# Patient Record
Sex: Female | Born: 1970 | Race: White | Hispanic: No | Smoking: Never smoker
Health system: Southern US, Community
[De-identification: ages and names within clinical notes are randomized; demographics above are authoritative.]

## PROBLEM LIST (undated history)

## (undated) DIAGNOSIS — F32A Depression, unspecified: Secondary | ICD-10-CM

## (undated) DIAGNOSIS — F419 Anxiety disorder, unspecified: Secondary | ICD-10-CM

## (undated) DIAGNOSIS — I10 Essential (primary) hypertension: Secondary | ICD-10-CM

## (undated) DIAGNOSIS — I480 Paroxysmal atrial fibrillation: Secondary | ICD-10-CM

## (undated) DIAGNOSIS — F329 Major depressive disorder, single episode, unspecified: Secondary | ICD-10-CM

## (undated) DIAGNOSIS — F909 Attention-deficit hyperactivity disorder, unspecified type: Secondary | ICD-10-CM

## (undated) DIAGNOSIS — G4730 Sleep apnea, unspecified: Secondary | ICD-10-CM

## (undated) DIAGNOSIS — M1990 Unspecified osteoarthritis, unspecified site: Secondary | ICD-10-CM

## (undated) DIAGNOSIS — M26629 Arthralgia of temporomandibular joint, unspecified side: Secondary | ICD-10-CM

## (undated) DIAGNOSIS — J45909 Unspecified asthma, uncomplicated: Secondary | ICD-10-CM

## (undated) DIAGNOSIS — T7840XA Allergy, unspecified, initial encounter: Secondary | ICD-10-CM

## (undated) DIAGNOSIS — I499 Cardiac arrhythmia, unspecified: Secondary | ICD-10-CM

## (undated) HISTORY — DX: Arthralgia of temporomandibular joint, unspecified side: M26.629

## (undated) HISTORY — DX: Unspecified osteoarthritis, unspecified site: M19.90

## (undated) HISTORY — DX: Depression, unspecified: F32.A

## (undated) HISTORY — DX: Attention-deficit hyperactivity disorder, unspecified type: F90.9

## (undated) HISTORY — DX: Cardiac arrhythmia, unspecified: I49.9

## (undated) HISTORY — DX: Essential (primary) hypertension: I10

## (undated) HISTORY — PX: TUBAL LIGATION: SHX77

## (undated) HISTORY — DX: Unspecified asthma, uncomplicated: J45.909

## (undated) HISTORY — DX: Sleep apnea, unspecified: G47.30

## (undated) HISTORY — DX: Paroxysmal atrial fibrillation: I48.0

## (undated) HISTORY — PX: TONSILLECTOMY: SUR1361

## (undated) HISTORY — DX: Allergy, unspecified, initial encounter: T78.40XA

## (undated) HISTORY — DX: Major depressive disorder, single episode, unspecified: F32.9

---

## 2000-05-23 DIAGNOSIS — G248 Other dystonia: Secondary | ICD-10-CM | POA: Insufficient documentation

## 2013-03-27 ENCOUNTER — Ambulatory Visit: Payer: Self-pay | Admitting: Medical

## 2013-03-27 ENCOUNTER — Ambulatory Visit (INDEPENDENT_AMBULATORY_CARE_PROVIDER_SITE_OTHER): Payer: PRIVATE HEALTH INSURANCE | Admitting: Medical

## 2013-03-27 ENCOUNTER — Encounter: Payer: Self-pay | Admitting: Medical

## 2013-03-27 VITALS — BP 142/90 | HR 72 | Temp 98.2°F | Resp 18 | Ht 68.5 in | Wt 201.0 lb

## 2013-03-27 DIAGNOSIS — R110 Nausea: Secondary | ICD-10-CM

## 2013-03-27 DIAGNOSIS — R030 Elevated blood-pressure reading, without diagnosis of hypertension: Secondary | ICD-10-CM

## 2013-03-27 DIAGNOSIS — IMO0001 Reserved for inherently not codable concepts without codable children: Secondary | ICD-10-CM

## 2013-03-27 DIAGNOSIS — R1013 Epigastric pain: Secondary | ICD-10-CM

## 2013-03-27 DIAGNOSIS — R21 Rash and other nonspecific skin eruption: Secondary | ICD-10-CM

## 2013-03-27 MED ORDER — MUPIROCIN 2 % EX OINT: 1.0000 "application " | TOPICAL_OINTMENT | Freq: Two times a day (BID) | CUTANEOUS | Status: DC

## 2013-03-27 MED ORDER — OMEPRAZOLE 40 MG PO CPDR: 40.0000 mg | DELAYED_RELEASE_CAPSULE | Freq: Every day | ORAL | Status: DC

## 2013-03-27 NOTE — Progress Notes (Signed)
Subjective:  Dana Ortiz is a 42 y.o. female who presents as a new patient today.   I see her parents, the Szenas'.    She is here for a complaint of abdominal pain. He reports ongoing abdominal pain since August 3 months ago. Thinks it started as heartburn, would take ibuprofen for this.   This seemed to help .  Thanksgiving evening had a particularly bad case of this, had a lot of belching, hiccups, burn, left upper quadrant pain radiating to the left shoulder. She went to the emergency department, had abdominal ultrasound, labs, sludge in the gallbladder, fatty liver, but liver enzymes are normal.  She is not sure about pancreas enzymes. Was prescribed Prilosec, advised to follow PCP is which she did this past Monday.  This past Monday was prescribed Dexilant by her primary care provider , she took it for 2 days, and stopped this because of nausea.  She called her provider who then called out Zantac and instead, and told her to have gallbladder surgery. She did not take the Zantac, and she is unhappy with the surgery recommendation.   Her pain is set off by eating anything, she can feel it coming on.  10 out of 10 pain, last for 3 hours or until the next meal in and it flares up again. He also reports chronic constipation, has 3 bowel movements a week, which are strained and hard.  He has been trying to eat more bland foods, no changes in stool color, no blood in stool, no mucous in stool, no diarrhea, no vomiting, no fever, no weight loss.  No prior HIDA scan or colonoscopy. He is concerned about an ulcer.  She drinks a lot of water. She doesn't eat a lot of fiber. Not exercising.  She has gained some weight in the past year, has a lot of stress in her life, take Cymbalta, but feels like this may be causing her to gain weight.  Has bursitis in elbow and shoulder.  Uses ibuprofen daily, for the past 5-6 months.  Usually takes 800mg  daily.   Stopped taking this October. Switched to Acetaminophen.  And recently started taking Aleve in the fall.   No other c/o.  The following portions of the patient's history were reviewed and updated as appropriate: allergies, current medications, past family history, past medical history, past social history, past surgical history and problem list.  ROS Otherwise as in subjective above  Objective: Physical Exam  BP 142/90  Pulse 72  Temp(Src) 98.2 F (36.8 C) (Oral)  Resp 18  Ht 5' 8.5" (1.74 m)  Wt 201 lb (91.173 kg)  BMI 30.11 kg/m2  General appearance: alert, no distress, WD/WN, obese white female HEENT: normocephalic, sclerae anicteric, conjunctiva pink and moist, TMs pearly, nares patent, no discharge or erythema, pharynx normal Oral cavity: MMM, no lesions Neck: supple, no lymphadenopathy, no thyromegaly, no masses Heart: RRR, normal S1, S2, no murmurs Lungs: CTA bilaterally, no wheezes, rhonchi, or rales Abdomen: +bs, soft, tender epigastric and somewhat left upper quadrant, otherwise non tender, non distended, no masses, no hepatomegaly, no splenomegaly Pulses: 2+ radial pulses, 2+ pedal pulses, normal cap refill Ext: no edema   Assessment: Encounter Diagnoses  Name Primary?  . Abdominal pain, epigastric Yes  . Nausea alone   . Rash and nonspecific skin eruption   . Elevated blood pressure     Plan: At etiology likely ulcer versus H. pylori versus GERD.  She apparently had abnormal gallbladder ultrasound, but it's not  obvious she has gallbladder disease. Advise she take no more NSAIDs. She will continue omeprazole 40 mg, 30 minutes before breakfast, will continue Zantac over-the-counter twice daily, she will keep her food intake bland and small portion.  We will refer to gastroenterology for likely endoscopy and additional testing.  Request prior records including recent emergency department visit from [facility]  Rash of face-appears to be folliculitis, begin  Bactroban ointment, to call back if not improving  Elevated blood pressure-request prior records, recheck in a month

## 2013-03-28 ENCOUNTER — Telehealth: Payer: Self-pay | Admitting: Medical

## 2013-03-29 ENCOUNTER — Other Ambulatory Visit: Payer: Self-pay | Admitting: Medical

## 2013-03-29 MED ORDER — SUCRALFATE 1 GM/10ML PO SUSP: 1.0000 g | Freq: Three times a day (TID) | ORAL | Status: DC

## 2013-03-29 NOTE — Telephone Encounter (Signed)
Patient is aware of her appointment and I fax over all information to there office. CLS

## 2013-03-29 NOTE — Telephone Encounter (Signed)
LMOM FOR THE OFFICE TO CALLBACK. CLS

## 2013-03-29 NOTE — Telephone Encounter (Signed)
See msg below about referral

## 2013-05-07 ENCOUNTER — Ambulatory Visit (INDEPENDENT_AMBULATORY_CARE_PROVIDER_SITE_OTHER): Payer: PRIVATE HEALTH INSURANCE | Admitting: Medical

## 2013-05-07 ENCOUNTER — Encounter: Payer: Self-pay | Admitting: Medical

## 2013-05-07 VITALS — BP 122/82 | HR 64 | Temp 97.9°F | Resp 16 | Wt 201.0 lb

## 2013-05-07 DIAGNOSIS — M549 Dorsalgia, unspecified: Secondary | ICD-10-CM

## 2013-05-07 DIAGNOSIS — R079 Chest pain, unspecified: Secondary | ICD-10-CM

## 2013-05-07 DIAGNOSIS — R109 Unspecified abdominal pain: Secondary | ICD-10-CM

## 2013-05-07 LAB — LIPASE: Lipase: 17 U/L (ref 0–75)

## 2013-05-07 LAB — AMYLASE: AMYLASE: 30 U/L (ref 0–105)

## 2013-05-07 NOTE — Progress Notes (Signed)
  Subjective:  Dana Ortiz is a 43 y.o. female who presents for recheck.  I saw her recently as a new patient for chronic abdominal pain issues.     Abdominal pain - still having ongoing pains. Saw general surgery, Dr. Eliseo SquiresBeachum.  Had EGD, and reportedly had hiatal hernia.  No GERD, ulcer, H Pylori seen.  Still having pain and frustrated.  Drinks 1 glass of wine nightly.    Chest pain - went to the ED this past week after having chest pain, left arm pain, tingling of arm pain.  Cardiac source rule out.  Had EKG, labs, CXR, chest CT. But they told her to f/u with PCP.  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   Not on any medication now.  No recent cardiology follow up.  No other c/o.  The following portions of the patient's history were reviewed and updated as appropriate: allergies, current medications, past family history, past medical history, past social history, past surgical history and problem list.  ROS Otherwise as in subjective above  Objective: Physical Exam  BP 122/82  Pulse 64  Temp(Src) 97.9 F (36.6 C) (Oral)  Resp 16  Wt 201 lb (91.173 kg)   General appearance: alert, no distress, WD/WN Neck: supple, no lymphadenopathy, no thyromegaly, no masses Chest: nontender Heart: RRR, normal S1, S2, no murmurs Lungs: CTA bilaterally, no wheezes, rhonchi, or rales Abdomen: +bs, soft, non tender, non distended, no masses, no hepatomegaly, no splenomegaly Back: mild upper back paraspinal tenderness, but otherwise nontender Pulses: 2+ radial pulses, 2+ pedal pulses, normal cap refill Ext: no edema   Assessment: Encounter Diagnoses  Name Primary?  . Abdominal pain Yes  . Back pain   . Chest pain     Plan: Unfortunately we have not received any [facility] or Dr. Eliseo SquiresBeachum.  Abdominal pain - lipase and amylase today.   Consider HIDA scan.  Will await  records.  For now, begin Miralax daily, avoid GERD triggers, big portions, gas causing foods.   Back pain - musculoskeletal.  Advised heat, massage, tylenol prn. Chest pain - will await records.   chset pain likely was musculoskeletal vs indigestion vs constipation vs prinzmetal's angina.  Consider referral back to cardiology. Follow up: pending records

## 2014-01-27 DIAGNOSIS — F909 Attention-deficit hyperactivity disorder, unspecified type: Secondary | ICD-10-CM | POA: Insufficient documentation

## 2014-12-18 DIAGNOSIS — J309 Allergic rhinitis, unspecified: Secondary | ICD-10-CM | POA: Insufficient documentation

## 2014-12-18 DIAGNOSIS — J4520 Mild intermittent asthma, uncomplicated: Secondary | ICD-10-CM | POA: Insufficient documentation

## 2015-02-23 DIAGNOSIS — F411 Generalized anxiety disorder: Secondary | ICD-10-CM | POA: Insufficient documentation

## 2015-10-30 DIAGNOSIS — G8929 Other chronic pain: Secondary | ICD-10-CM | POA: Insufficient documentation

## 2016-02-18 DIAGNOSIS — I10 Essential (primary) hypertension: Secondary | ICD-10-CM | POA: Insufficient documentation

## 2016-07-21 ENCOUNTER — Telehealth: Payer: Self-pay | Admitting: *Deleted

## 2016-07-21 NOTE — Telephone Encounter (Signed)
Notes sent scheduling °

## 2016-08-04 ENCOUNTER — Encounter: Payer: Self-pay | Admitting: *Deleted

## 2016-08-05 ENCOUNTER — Ambulatory Visit (INDEPENDENT_AMBULATORY_CARE_PROVIDER_SITE_OTHER): Payer: BLUE CROSS/BLUE SHIELD | Admitting: Cardiovascular Disease

## 2016-08-05 ENCOUNTER — Encounter: Payer: Self-pay | Admitting: Cardiovascular Disease

## 2016-08-05 ENCOUNTER — Telehealth: Payer: Self-pay | Admitting: Cardiovascular Disease

## 2016-08-05 VITALS — BP 116/60 | HR 57 | Ht 68.0 in | Wt 176.6 lb

## 2016-08-05 DIAGNOSIS — I493 Ventricular premature depolarization: Secondary | ICD-10-CM | POA: Diagnosis not present

## 2016-08-05 DIAGNOSIS — I4891 Unspecified atrial fibrillation: Secondary | ICD-10-CM | POA: Diagnosis not present

## 2016-08-05 DIAGNOSIS — R079 Chest pain, unspecified: Secondary | ICD-10-CM | POA: Diagnosis not present

## 2016-08-05 DIAGNOSIS — I10 Essential (primary) hypertension: Secondary | ICD-10-CM | POA: Diagnosis not present

## 2016-08-05 DIAGNOSIS — R002 Palpitations: Secondary | ICD-10-CM

## 2016-08-05 DIAGNOSIS — Z9289 Personal history of other medical treatment: Secondary | ICD-10-CM

## 2016-08-05 MED ORDER — METOPROLOL SUCCINATE ER 50 MG PO TB24: 50.0000 mg | ORAL_TABLET | Freq: Every day | ORAL | 1 refills | Status: DC

## 2016-08-05 NOTE — Patient Instructions (Signed)
Your physician recommends that you schedule a follow-up appointment in: 1 MONTH WITH DR [facility]  Your physician has recommended you make the following change in your medication:   STOP LOPRESSOR  START TOPROL XL 50 MG DAILY  Your physician has requested that you have an echocardiogram. Echocardiography is a painless test that uses sound waves to create images of your heart. It provides your doctor with information about the size and shape of your heart and how well your heart's chambers and valves are working. This procedure takes approximately one hour. There are no restrictions for this procedure.  Your physician recommends that you return for lab work K/MG  Thank you for choosing [facility]!!

## 2016-08-05 NOTE — Telephone Encounter (Signed)
ECHO May 16th in [place]

## 2016-08-05 NOTE — Progress Notes (Signed)
CARDIOLOGY CONSULT NOTE  Patient ID: Dana Ortiz MRN: [id number] DOB/AGE: [birth year] 46 y.o.  Admit date: (Not on file) Primary Physician: Rebecka Apley, NP Referring Physician: Hemberg  Reason for Consultation: atrial fibrillation  HPI: Dana Ortiz is a 46 y.o. female who is being seen today for the evaluation of atrial fibrillation at the request of Hemberg, Ruby Cola, NP.   She is a 46 year old woman who was evaluated for chest pain and palpitations at [facility] on 07/07/16. I personally reviewed all office notes as well as hospitalization documentation, labs, and studies.  Heart rate was initially 138 bpm upon arrival. Oxygen saturations were normal. She was found to be in new onset rapid atrial fibrillation and was given IV metoprolol. Electrolytes and TSH were normal. She was started on metoprolol and Xarelto.  Pertinent labs: Sodium 138, potassium 3.6, BUN 18, creatinine 0.69, magnesium 2.1, normal troponin, TSH 0.69, d-dimer 0.4, hemoglobin 15.8.  Chest x-ray showed no active disease.  I personally reviewed the ECG performed on 07/07/16 at 9:55 AM which demonstrated atrial fibrillation, heart rate 78 bpm.  I personally reviewed the ECG performed on 07/07/16 performed at 8:05 AM which showed rapid atrial fibrillation, heart rate 137 bpm.  She moved here about 12 years ago from [place]. She has had palpitations on and off for several years. 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 She was started on magnesium and had been doing well ever since then.  She has had increasing exertional dyspnea over the past few years particularly when climbing stairs.  Since being discharged from the hospital she has not felt well. She has had palpitations and sensations of flushing and lightheadedness. She was prescribed metoprolol 25 mg twice daily but has taken 50 mg every  morning for the past 2 mornings. She has not taken Xarelto for the past 3 days and she is on her menstrual cycle.  She has also had episodic chest pains in the retrosternal region and left inframammary region.  ECG performed in the office today which I ordered and personally interpreted demonstrates normal sinus rhythm with frequent PVC's, 73 bpm.   Social history: She is originally from [place]. She is divorced. She has a boyfriend. 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   Allergies  Allergen Reactions  . Morphine And Related Itching    Current Outpatient Prescriptions  Medication Sig Dispense Refill  . amphetamine-dextroamphetamine (ADDERALL) 20 MG tablet Take 20 mg by mouth 3 (three) times daily.    . budesonide (PULMICORT) 180 MCG/ACT inhaler Inhale into the lungs 2 (two) times daily.    . cyclobenzaprine (FLEXERIL) 10 MG tablet Take 10 mg by mouth 3 (three) times daily as needed for muscle spasms.    . fluticasone (FLONASE) 50 MCG/ACT nasal spray Place into both nostrils daily.    Marland Kitchen HYDROcodone-acetaminophen (NORCO/VICODIN) 5-325 MG tablet Take 1 tablet by mouth every 6 (six) hours as needed for moderate pain.    . metoprolol tartrate (LOPRESSOR) 25 MG tablet Take 25 mg by mouth 2 (two) times daily.    . rivaroxaban (XARELTO) 20 MG TABS tablet Take 20 mg by mouth daily with supper.      No current facility-administered medications for this visit.     Past Medical History:  Diagnosis Date  . Allergy   . Arrhythmia  afib  . Asthma   . Depression   . Hypertension     Past Surgical History:  Procedure Laterality Date  . TUBAL LIGATION      Social History   Social History  . Marital status: Married    Spouse name: N/A  . Number of children: N/A  . Years of education: N/A   Occupational History  . Not on file.   Social History Main Topics  . Smoking status: Never Smoker  . Smokeless tobacco: Never Used  . Alcohol use Not  on file  . Drug use: No  . Sexual activity: Not on file   Other Topics Concern  . 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  Current Meds  Medication Sig  . amphetamine-dextroamphetamine (ADDERALL) 20 MG tablet Take 20 mg by mouth 3 (three) times daily.  . budesonide (PULMICORT) 180 MCG/ACT inhaler Inhale into the lungs 2 (two) times daily.  . cyclobenzaprine (FLEXERIL) 10 MG tablet Take 10 mg by mouth 3 (three) times daily as needed for muscle spasms.  . fluticasone (FLONASE) 50 MCG/ACT nasal spray Place into both nostrils daily.  Marland Kitchen HYDROcodone-acetaminophen (NORCO/VICODIN) 5-325 MG tablet Take 1 tablet by mouth every 6 (six) hours as needed for moderate pain.  . metoprolol tartrate (LOPRESSOR) 25 MG tablet Take 25 mg by mouth 2 (two) times daily.      Review of systems complete and found to be negative unless listed above in HPI    Physical exam Blood pressure 118/62, pulse (!) 55, height  (1.727 m), weight 176 lb 9.6 oz (80.1 kg), SpO2 98 %. General: NAD Neck: No JVD, no thyromegaly or thyroid nodule.  Lungs: Clear to auscultation bilaterally with normal respiratory effort. CV: Nondisplaced PMI. Regular rate and irregular rhythm with frequent premature contractions, normal S1/S2, no S3/S4, no murmur.  No peripheral edema.  No carotid bruit.    Abdomen: Soft, nontender, no distention.  Skin: Intact without lesions or rashes.  Neurologic: Alert and oriented x 3.  Psych: Normal affect. Extremities: No clubbing or cyanosis.  HEENT: Normal.   ECG: Most recent ECG reviewed.   Labs: No results found for: K, BUN, CREATININE, ALT, TSH, HGB   Lipids: No results found for: LDLCALC, LDLDIRECT, CHOL, TRIG, HDL      ASSESSMENT AND PLAN:  1. New onset atrial fibrillation: She continues to have palpitations and has frequent PVC's. I will switch  metoprolol tartrate to succinate, 50 mg every morning. I will check K and Mg levels. She is anticoagulated with Xarelto. I will order a 2-D echocardiogram with Doppler to evaluate cardiac structure, function, and regional wall motion. We talked about AV nodal blocking agents, antiarrhythmic therapy, and possible ablation.  CHADSVASC score is 2 (HTN, gender), thus I will continue Xarelto.  2. Hypertension: Controlled. Will monitor given switch to Toprol-XL.  3. Frequent PVC's/palpitations:  I will check K and Mg levels. I will switch metoprolol tartrate to succinate, 50 mg every morning.  Disposition: Follow up in 1 month  Signed: Prentice Docker, M.D., F.A.C.C.  08/05/2016, 8:54 AM

## 2016-08-31 ENCOUNTER — Telehealth: Payer: Self-pay | Admitting: Cardiovascular Disease

## 2016-08-31 ENCOUNTER — Other Ambulatory Visit: Payer: BLUE CROSS/BLUE SHIELD

## 2016-08-31 NOTE — Telephone Encounter (Signed)
Pre-cert Verification for the following procedure    Echo (rescheduled per patient) for 09/29/16 at [facility] Will need to re-certify

## 2016-09-14 ENCOUNTER — Ambulatory Visit: Payer: BLUE CROSS/BLUE SHIELD | Admitting: Cardiovascular Disease

## 2016-09-29 ENCOUNTER — Other Ambulatory Visit: Payer: Self-pay

## 2016-09-29 ENCOUNTER — Ambulatory Visit (INDEPENDENT_AMBULATORY_CARE_PROVIDER_SITE_OTHER): Payer: BLUE CROSS/BLUE SHIELD

## 2016-09-29 DIAGNOSIS — I4891 Unspecified atrial fibrillation: Secondary | ICD-10-CM | POA: Diagnosis not present

## 2016-09-30 ENCOUNTER — Telehealth: Payer: Self-pay | Admitting: *Deleted

## 2016-09-30 NOTE — Telephone Encounter (Signed)
Notes recorded by Lesle ChrisHill, Angela G, LPN on 1/61/09606/15/2018 at 4:40 PM EDT Left message to return call.  ------  Notes recorded by Laqueta LindenKoneswaran, Suresh A, MD on 09/29/2016 at 4:32 PM EDT Normal pumping function.

## 2016-10-03 NOTE — Telephone Encounter (Signed)
Notes recorded by Lesle ChrisHill, Angela G, LPN on 6/96/29526/18/2018 at 4:37 PM EDT Patient notified. Copy to pmd. Follow up scheduled for 10/24/2016

## 2016-10-24 ENCOUNTER — Ambulatory Visit (INDEPENDENT_AMBULATORY_CARE_PROVIDER_SITE_OTHER): Payer: BLUE CROSS/BLUE SHIELD | Admitting: Cardiovascular Disease

## 2016-10-24 ENCOUNTER — Encounter: Payer: Self-pay | Admitting: Cardiovascular Disease

## 2016-10-24 VITALS — BP 151/78 | HR 68 | Ht 68.0 in | Wt 187.2 lb

## 2016-10-24 DIAGNOSIS — T50905A Adverse effect of unspecified drugs, medicaments and biological substances, initial encounter: Secondary | ICD-10-CM

## 2016-10-24 DIAGNOSIS — T887XXA Unspecified adverse effect of drug or medicament, initial encounter: Secondary | ICD-10-CM

## 2016-10-24 DIAGNOSIS — R4189 Other symptoms and signs involving cognitive functions and awareness: Secondary | ICD-10-CM | POA: Diagnosis not present

## 2016-10-24 DIAGNOSIS — R002 Palpitations: Secondary | ICD-10-CM | POA: Diagnosis not present

## 2016-10-24 DIAGNOSIS — I480 Paroxysmal atrial fibrillation: Secondary | ICD-10-CM | POA: Diagnosis not present

## 2016-10-24 DIAGNOSIS — I493 Ventricular premature depolarization: Secondary | ICD-10-CM

## 2016-10-24 DIAGNOSIS — I10 Essential (primary) hypertension: Secondary | ICD-10-CM

## 2016-10-24 MED ORDER — DILTIAZEM HCL ER COATED BEADS 120 MG PO CP24: 120.0000 mg | ORAL_CAPSULE | Freq: Every day | ORAL | 6 refills | Status: DC

## 2016-10-24 NOTE — Patient Instructions (Signed)
Medication Instructions:   Stop Toprol XL.  Begin Cardizem CD 120mg  daily.  Continue all other medications.    Labwork: none  Testing/Procedures: none  Follow-Up: 3rd week of August   Any Other Special Instructions Will Be Listed Below (If Applicable).  If you need a refill on your cardiac medications before your next appointment, please call your pharmacy.

## 2016-10-24 NOTE — Progress Notes (Signed)
SUBJECTIVE: The patient presents for follow-up of atrial fibrillation.  Echocardiogram 09/29/16 demonstrated normal left ventricular systolic function and regional wall motion, LVEF 55-60%, mild mitral regurgitation, with mild to moderate LVH. The left atrium was normal in size.  While palpitations have been less frequent, she has felt very fatigued on Toprol-XL. She switched from taking it in the morning to taking it in the evenings and while this has helped to some degree, she still feels sluggish when she wakes up. She has also put on about 11 pounds.   She has been exercising at [employer] daily. She notices certain foods which increase the frequency of palpitations such as chocolate, wine, beer, and medications such as ibuprofen.  She denies orthopnea and leg swelling.  She did not get her potassium and magnesium checked yet. She takes supplemental magnesium tablets 250 mg daily and also tries to eat bananas regularly.     Social history: She is originally from [place]. She is divorced. She has a boyfriend. 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 She attended college at [employer].  Review of Systems: As per "subjective", otherwise negative.  Allergies  Allergen Reactions  . Morphine And Related Itching    Current Outpatient Prescriptions  Medication Sig Dispense Refill  . amphetamine-dextroamphetamine (ADDERALL) 20 MG tablet Take 20 mg by mouth 3 (three) times daily.    . budesonide (PULMICORT) 180 MCG/ACT inhaler Inhale into the lungs 2 (two) times daily.    . cyclobenzaprine (FLEXERIL) 10 MG tablet Take 10 mg by mouth 3 (three) times daily as needed for muscle spasms.    . fluticasone (FLONASE) 50 MCG/ACT nasal spray Place into both nostrils daily.    Marland Kitchen. HYDROcodone-acetaminophen (NORCO/VICODIN) 5-325 MG tablet Take 1 tablet by mouth every 6 (six) hours as needed for moderate pain.    . metoprolol succinate (TOPROL-XL) 50 MG 24  hr tablet Take 1 tablet (50 mg total) by mouth daily. 90 tablet 1  . rivaroxaban (XARELTO) 20 MG TABS tablet Take 20 mg by mouth daily with supper.      No current facility-administered medications for this visit.     Past Medical History:  Diagnosis Date  . Allergy   . Arrhythmia    afib  . Asthma   . Depression   . Hypertension     Past Surgical History:  Procedure Laterality Date  . TUBAL LIGATION      Social History   Social History  . Marital status: Married    Spouse name: N/A  . Number of children: N/A  . Years of education: N/A   Occupational History  . Not on file.   Social History Main Topics  . Smoking status: Never Smoker  . Smokeless tobacco: Never Used  . Alcohol use Not on file  . Drug use: No  . Sexual activity: Not on file   Other Topics Concern  . 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 Neck: No JVD,  no thyromegaly. Lungs: Clear to auscultation bilaterally with normal respiratory effort. CV: Nondisplaced PMI.  Regular rate and rhythm, normal S1/S2, no S3/S4, no murmur. No pretibial or periankle edema.     Abdomen: Soft, nontender, no distention.  Neurologic: Alert and oriented.  Psych: Normal affect. Skin: Normal. Musculoskeletal: No gross deformities.    ECG: Most recent ECG reviewed.   Labs: No results found for: K, BUN, CREATININE, ALT, TSH, HGB   Lipids: No results found for: LDLCALC, LDLDIRECT, CHOL, TRIG, HDL     ASSESSMENT AND PLAN: 1. Paroxysmal atrial fibrillation: Currently on Toprol-XL 50 mg daily with multiple adverse effects as detailed above.  I will switch to long-acting diltiazem 120 mg daily. She is anticoagulated with Xarelto. We previously talked about antiarrhythmic therapy and possible ablation.   2. Hypertension: Blood pressure is elevated. I will monitor given switch from metoprolol succinate to long-acting diltiazem.  3. Frequent PVC's/palpitations: These have improved with dietary changes and the addition of long-acting AV nodal blocking agents. She will have her potassium and magnesium levels checked.    Disposition: Follow up late August 2018   Prentice Docker, M.D., F.A.C.C.

## 2016-10-24 NOTE — Addendum Note (Signed)
Addended by: Lesle ChrisHILL, Takara Sermons G on: 10/24/2016 05:01 PM   Modules accepted: Orders

## 2016-10-28 ENCOUNTER — Other Ambulatory Visit: Payer: Self-pay | Admitting: *Deleted

## 2016-10-28 MED ORDER — RIVAROXABAN 20 MG PO TABS: 20.0000 mg | ORAL_TABLET | Freq: Every day | ORAL | 3 refills | Status: DC

## 2016-11-02 ENCOUNTER — Telehealth: Payer: Self-pay | Admitting: Cardiovascular Disease

## 2016-11-02 NOTE — Telephone Encounter (Signed)
Thinks she may be having restless leg syndrome.  Thinks may be related to the Diltiazem.  Also, has metallic taste in her mouth.  Not sleeping well at night either.  Did not have these symptoms till starting this medication.  No c/o chest pain, dizziness, or SOB.  Does actually think that the AFib is better on this medication though.    C/O the Xarelto making her bleed a lot heavier on her menses.  Any other options on that or can she hold during that time frame.

## 2016-11-02 NOTE — Telephone Encounter (Signed)
LMTCB

## 2016-11-02 NOTE — Telephone Encounter (Signed)
Patient thinks she is experiencing lots of bad symptoms since starting medcation cant sleep and metal taste in  Mouth  Also, has questions about xralto

## 2016-11-02 NOTE — Telephone Encounter (Signed)
She did not tolerate Toprol-XL and now is not tolerating diltiazem. I would have her evaluated in atrial fibrillation clinic for additional options. Ok to hold Xarelto during menses for now.

## 2016-11-03 NOTE — Telephone Encounter (Signed)
Patient notified and verbalized understanding. 

## 2016-11-03 NOTE — Telephone Encounter (Signed)
Appointment scheduled for tomorrow morning at 11:30 am in the AFib Clinic.    Left message to return call.

## 2016-11-03 NOTE — Telephone Encounter (Signed)
Left message to return call 

## 2016-11-04 ENCOUNTER — Encounter (HOSPITAL_COMMUNITY): Payer: Self-pay | Admitting: Nurse Practitioner

## 2016-11-04 ENCOUNTER — Ambulatory Visit (HOSPITAL_COMMUNITY)
Admission: RE | Admit: 2016-11-04 | Discharge: 2016-11-04 | Disposition: A | Discharge status: Home / Self Care | Payer: BLUE CROSS/BLUE SHIELD | Source: Ambulatory Visit | Attending: Nurse Practitioner | Admitting: Nurse Practitioner

## 2016-11-04 VITALS — BP 126/90 | HR 69 | Ht 68.0 in | Wt 188.0 lb

## 2016-11-04 DIAGNOSIS — I10 Essential (primary) hypertension: Secondary | ICD-10-CM | POA: Insufficient documentation

## 2016-11-04 DIAGNOSIS — Z8489 Family history of other specified conditions: Secondary | ICD-10-CM | POA: Insufficient documentation

## 2016-11-04 DIAGNOSIS — J45909 Unspecified asthma, uncomplicated: Secondary | ICD-10-CM | POA: Insufficient documentation

## 2016-11-04 DIAGNOSIS — I4891 Unspecified atrial fibrillation: Secondary | ICD-10-CM | POA: Insufficient documentation

## 2016-11-04 DIAGNOSIS — Z833 Family history of diabetes mellitus: Secondary | ICD-10-CM | POA: Insufficient documentation

## 2016-11-04 DIAGNOSIS — Z7901 Long term (current) use of anticoagulants: Secondary | ICD-10-CM | POA: Diagnosis not present

## 2016-11-04 DIAGNOSIS — F329 Major depressive disorder, single episode, unspecified: Secondary | ICD-10-CM | POA: Insufficient documentation

## 2016-11-04 DIAGNOSIS — Z885 Allergy status to narcotic agent status: Secondary | ICD-10-CM | POA: Insufficient documentation

## 2016-11-04 DIAGNOSIS — Z8249 Family history of ischemic heart disease and other diseases of the circulatory system: Secondary | ICD-10-CM | POA: Diagnosis not present

## 2016-11-04 DIAGNOSIS — Z9889 Other specified postprocedural states: Secondary | ICD-10-CM | POA: Diagnosis not present

## 2016-11-04 DIAGNOSIS — Z79899 Other long term (current) drug therapy: Secondary | ICD-10-CM | POA: Diagnosis not present

## 2016-11-04 DIAGNOSIS — I480 Paroxysmal atrial fibrillation: Secondary | ICD-10-CM | POA: Diagnosis not present

## 2016-11-04 LAB — COMPREHENSIVE METABOLIC PANEL
ALT: 25 U/L (ref 14–54)
AST: 26 U/L (ref 15–41)
Albumin: 4.1 g/dL (ref 3.5–5.0)
Alkaline Phosphatase: 42 U/L (ref 38–126)
Anion gap: 8 (ref 5–15)
BILIRUBIN TOTAL: 0.5 mg/dL (ref 0.3–1.2)
BUN: 15 mg/dL (ref 6–20)
CO2: 24 mmol/L (ref 22–32)
CREATININE: 0.77 mg/dL (ref 0.44–1.00)
Calcium: 9.2 mg/dL (ref 8.9–10.3)
Chloride: 103 mmol/L (ref 101–111)
Glucose, Bld: 100 mg/dL — ABNORMAL HIGH (ref 65–99)
POTASSIUM: 3.6 mmol/L (ref 3.5–5.1)
Sodium: 135 mmol/L (ref 135–145)
TOTAL PROTEIN: 7 g/dL (ref 6.5–8.1)

## 2016-11-04 LAB — MAGNESIUM: MAGNESIUM: 2.2 mg/dL (ref 1.7–2.4)

## 2016-11-04 LAB — TSH: TSH: 1.805 u[IU]/mL (ref 0.350–4.500)

## 2016-11-04 MED ORDER — DILTIAZEM HCL 30 MG PO TABS: ORAL_TABLET | ORAL | 0 refills | Status: DC

## 2016-11-04 NOTE — Patient Instructions (Signed)
Start Cardizem 30 mg as needed  Take one tablet by mouth every 4 hours AS NEEDED for A-fib HR > 100 as long as BP > 100.   Follow up in one month; we will touch base on scheduling

## 2016-11-04 NOTE — Progress Notes (Signed)
Primary Care Physician: Rebecka Apley, NP Referring Physician: Dr. Pamala Duffel [place] is a 46 y.o. female with a h/o afib that was first diagnosed in April of this year. She was initially treated with metoprolol. This however mad her gain weight and feel very sluggish. She most recently went on daily Cardizem and gave her restless leges. She was referred to the afib clinic for further options. She states that her afib recently has been quiet. Has occasional PAC's/PVC's.Echo showed normal EF with mild/mod LVH. She denies any significant alcohol, caffeine, tobacco. Denies snoring. She has also had issues with heavy menses with xarelto 20 mg daily and stopped drug. 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 Chads vasc score of 3(htn, Female, LV dysfunction)  Today, she denies symptoms of palpitations, chest pain, shortness of breath, orthopnea, PND, lower extremity edema, dizziness, presyncope, syncope, or neurologic sequela. The patient is tolerating medications without difficulties and is otherwise without complaint today.   Past Medical History:  Diagnosis Date  . Allergy   . Arrhythmia    afib  . Asthma   . Depression   . Hypertension    Past Surgical History:  Procedure Laterality Date  . TUBAL LIGATION      Current Outpatient Prescriptions  Medication Sig Dispense Refill  . amphetamine-dextroamphetamine (ADDERALL) 20 MG tablet Take 20 mg by mouth 3 (three) times daily.    . budesonide (PULMICORT) 180 MCG/ACT inhaler Inhale into the lungs 2 (two) times daily as needed.     . cyclobenzaprine (FLEXERIL) 10 MG tablet Take 10 mg by mouth 3 (three) times daily as needed for muscle spasms.    . fluticasone (FLONASE) 50 MCG/ACT nasal spray Place into both nostrils daily.    Marland Kitchen HYDROcodone-acetaminophen (NORCO/VICODIN) 5-325 MG tablet Take 1 tablet by mouth every 6 (six) hours as needed for  moderate pain.    . metoprolol succinate (TOPROL-XL) 50 MG 24 hr tablet Take 50 mg by mouth daily. Take with or immediately following a meal.    . traMADol (ULTRAM) 50 MG tablet Take 50 mg by mouth every 6 (six) hours as needed.    . diltiazem (CARDIZEM) 30 MG tablet Take one tablet by mouth every 4 hours AS NEEDED for A-fib HR > 100 as long as BP > 100. 30 tablet 0   No current facility-administered medications for this encounter.     Allergies  Allergen Reactions  . Morphine And Related Itching  . Percocet [Oxycodone-Acetaminophen]     Social History   Social History  . Marital status: Married    Spouse name: N/A  . Number of children: N/A  . Years of education: N/A   Occupational History  . Not on file.   Social History Main Topics  . Smoking status: Never Smoker  . Smokeless tobacco: Never Used  . Alcohol use Not on file  . Drug use: No  . Sexual activity: Not on file   Other Topics Concern  . 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 Diabetes Mother   . Glaucoma Mother   . Heart Problems Brother     ROS- All systems are reviewed and negative except as per the HPI above  Physical Exam: Vitals:   11/04/16 1145  BP: 126/90  Pulse: 69  Weight: 188 lb (85.3 kg)  Height: 5\' 8"  (1.727 m)   Wt Readings from Last 3 Encounters:  11/04/16 188 lb (85.3 kg)  10/24/16 187 lb 3.2 oz (84.9 kg)  08/05/16 176 lb 9.6 oz (80.1 kg)    Labs: Lab Results  Component Value Date   NA 135 11/04/2016   K 3.6 11/04/2016   CL 103 11/04/2016   CO2 24 11/04/2016   GLUCOSE 100 (H) 11/04/2016   BUN 15 11/04/2016   CREATININE 0.77 11/04/2016   CALCIUM 9.2 11/04/2016   MG 2.2 11/04/2016   No results found for: INR No results found for: CHOL, HDL, LDLCALC, TRIG   GEN- The patient is well appearing, alert and oriented x 3 today.   Head- normocephalic,  atraumatic Eyes-  Sclera clear, conjunctiva pink Ears- hearing intact Oropharynx- clear Neck- supple, no JVP Lymph- no cervical lymphadenopathy Lungs- Clear to ausculation bilaterally, normal work of breathing Heart- Regular rate and rhythm, no murmurs, rubs or gallops, PMI not laterally displaced GI- soft, NT, ND, + BS Extremities- no clubbing, cyanosis, or edema MS- no significant deformity or atrophy Skin- no rash or lesion Psych- euthymic mood, full affect Neuro- strength and sensation are intact  EKG-SR at 69,Sr with occasional with PVC's,pr itn 154 ms, qrs int 82 ms, qtx 462 ms Epic records reviewed Echo-Study Conclusions  - Left ventricle: The cavity size was normal. Wall thickness was   increased increased in a pattern of mild to moderate LVH.   Systolic function was normal. The estimated ejection fraction was   in the range of 55% to 60%. Wall motion was normal; there were no   regional wall motion abnormalities. The study is not technically   sufficient to allow evaluation of LV diastolic function. - Mitral valve: There was mild regurgitation. - Right atrium: Central venous pressure (est): 3 mm Hg. - Atrial septum: No defect or patent foramen ovale was identified. - Tricuspid valve: There was trivial regurgitation. - Pulmonary arteries: Systolic pressure could not be accurately   estimated. - Pericardium, extracardiac: There was no pericardial effusion.  Impressions:  - Mild to moderate LVH with LVEF 55-60%. Indeterminate diastolic   function in the setting of coarse atrial fibrillation. Mild   mitral regurgitation. Trivial tricuspid regurgitation.    Assessment and Plan: 1. New onset afib  General info re afib discussed Intolerant of daily rate control meds Will stop daily meds and give 30 mg cardizem to use pill in pocket as needed If afib burden increases, will consider multaq 400 mg bid She was encouraged to go back on xarelto 20 mg daily for a  chadsvasc score of 3, since she is out of school currently, will discuss further with PharmD her  bleeding issues and any options  2. HTN She will watch her BP and go back on amlodipine if BP elevates off daily BP med  F/u in one month or sooner if issues  Lupita LeashDonna C. Matthew Folksarroll, ANP-C [facility] [address] [place], [place] [address] [phone]

## 2016-11-21 ENCOUNTER — Inpatient Hospital Stay (HOSPITAL_COMMUNITY)
Admission: RE | Admit: 2016-11-21 | Payer: BLUE CROSS/BLUE SHIELD | Source: Ambulatory Visit | Admitting: Nurse Practitioner

## 2016-12-13 ENCOUNTER — Encounter: Payer: Self-pay | Admitting: Cardiovascular Disease

## 2016-12-13 ENCOUNTER — Ambulatory Visit (INDEPENDENT_AMBULATORY_CARE_PROVIDER_SITE_OTHER): Payer: BLUE CROSS/BLUE SHIELD | Admitting: Cardiovascular Disease

## 2016-12-13 VITALS — BP 142/79 | HR 61 | Ht 68.0 in | Wt 186.0 lb

## 2016-12-13 DIAGNOSIS — R079 Chest pain, unspecified: Secondary | ICD-10-CM | POA: Diagnosis not present

## 2016-12-13 DIAGNOSIS — Z7189 Other specified counseling: Secondary | ICD-10-CM | POA: Diagnosis not present

## 2016-12-13 DIAGNOSIS — I493 Ventricular premature depolarization: Secondary | ICD-10-CM

## 2016-12-13 DIAGNOSIS — I10 Essential (primary) hypertension: Secondary | ICD-10-CM

## 2016-12-13 DIAGNOSIS — I480 Paroxysmal atrial fibrillation: Secondary | ICD-10-CM | POA: Diagnosis not present

## 2016-12-13 MED ORDER — AMLODIPINE BESYLATE 10 MG PO TABS: 10.0000 mg | ORAL_TABLET | Freq: Every day | ORAL | Status: DC

## 2016-12-13 MED ORDER — LOSARTAN POTASSIUM 25 MG PO TABS: 25.0000 mg | ORAL_TABLET | Freq: Every day | ORAL | 6 refills | Status: DC

## 2016-12-13 NOTE — Patient Instructions (Addendum)
Medication Instructions:   Increase Amlodipine to 10mg  daily.  Will use current supply she has at home.   Begin Losartan 25mg  daily after finish the Amlodipine.  Continue all other medications.    Labwork: none  Testing/Procedures: none  Follow-Up: 2 months   Any Other Special Instructions Will Be Listed Below (If Applicable).  Please call the office if chest pain recurs and / or gets worse.  Your physician has requested that you regularly monitor and record your blood pressure readings at home. Please take readings 3-4 x week for 2 months & bring to next office visit for MD review.    If you need a refill on your cardiac medications before your next appointment, please call your pharmacy.

## 2016-12-13 NOTE — Progress Notes (Signed)
SUBJECTIVE: The patient presents for follow-up of atrial fibrillation. She did not tolerate metoprolol as it made her feel sluggish and caused weight gain. Diltiazem causes restless legs. Xarelto caused heavy menses so she stopped taking this for a period of time as well. I had her referred to the atrial fibrillation clinic and she was evaluated on 11/04/16. She was told to take 30 mg of diltiazem as needed.  She has now been taking Xarelto 20 mg daily with the exception of during her menstrual cycle which is about 5 days.  She has had palpitations and has taken diltiazem with relief.  Her blood pressure has been elevated lately. She is taking amlodipine 5 mg daily and has done so for 2 weeks.  She had some episodes of chest pain which occurred last week lasting 30-45 minutes. There was no association with food. There was also no association with atrial fibrillation.   Social history:She is originally from [place]. She is divorced. She has a boyfriend. 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 She attended college at [employer].  Review of Systems: As per "subjective", otherwise negative.  Allergies  Allergen Reactions  . Morphine And Related Itching  . Percocet [Oxycodone-Acetaminophen]     Current Outpatient Prescriptions  Medication Sig Dispense Refill  . budesonide (PULMICORT) 180 MCG/ACT inhaler Inhale into the lungs 2 (two) times daily as needed.     . cyclobenzaprine (FLEXERIL) 10 MG tablet Take 10 mg by mouth 3 (three) times daily as needed for muscle spasms.    Marland Kitchen diltiazem (CARDIZEM) 30 MG tablet Take one tablet by mouth every 4 hours AS NEEDED for A-fib HR > 100 as long as BP > 100. 30 tablet 0  . fluticasone (FLONASE) 50 MCG/ACT nasal spray Place into both nostrils daily.    Marland Kitchen HYDROcodone-acetaminophen (NORCO/VICODIN) 5-325 MG tablet Take 1 tablet by mouth every 6 (six) hours as needed for moderate pain.    Marland Kitchen lisdexamfetamine  (VYVANSE) 60 MG capsule Take by mouth.    . metoprolol succinate (TOPROL-XL) 50 MG 24 hr tablet Take 50 mg by mouth daily. Take with or immediately following a meal.    . traMADol (ULTRAM) 50 MG tablet Take 50 mg by mouth every 6 (six) hours as needed.    Marland Kitchen amLODipine (NORVASC) 10 MG tablet Take 1 tablet (10 mg total) by mouth daily.    Marland Kitchen losartan (COZAAR) 25 MG tablet Take 1 tablet (25 mg total) by mouth daily. 30 tablet 6   No current facility-administered medications for this visit.     Past Medical History:  Diagnosis Date  . Allergy   . Arrhythmia    afib  . Asthma   . Depression   . Hypertension     Past Surgical History:  Procedure Laterality Date  . TUBAL LIGATION      Social History   Social History  . Marital status: Married    Spouse name: N/A  . Number of children: N/A  . Years of education: N/A   Occupational History  . Not on file.   Social History Main Topics  . Smoking status: Never Smoker  . Smokeless tobacco: Never Used  . Alcohol use Not on file  . Drug use: No  . Sexual activity: Not on file   Other Topics Concern  . 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 Neck: No JVD, no thyromegaly. Lungs: Clear to auscultation bilaterally with normal respiratory effort. CV: Regular rate and irregular rhythm with frequent premature contractions, normal S1/S2, no S3/S4, no murmur. No pretibial or periankle edema.  No carotid bruit.   Abdomen: Soft, nontender, no distention.  Neurologic: Alert and oriented.  Psych: Normal affect. Skin: Normal. Musculoskeletal: No gross deformities.    ECG:  Most recent ECG reviewed.   Labs: Lab Results  Component Value Date/Time   K 3.6 11/04/2016 12:30 PM   BUN 15 11/04/2016 12:30 PM   CREATININE 0.77 11/04/2016 12:30 PM   ALT 25 11/04/2016 12:30 PM   TSH 1.805 11/04/2016 12:30 PM     Lipids: No results found for: LDLCALC, LDLDIRECT, CHOL, TRIG, HDL     ASSESSMENT AND PLAN: 1. Paroxysmal atrial fibrillation: She now takes diltiazem 30 mg as needed. We had a long discussion about anticoagulation. She will continue Xarelto 20 mg daily. I informed her of the increased stroke risk if she is off of anticoagulation while being in atrial fibrillation. She neither tolerated beta blockers nor long-acting diltiazem.  2. Hypertension: Blood pressure is elevated. I will increase amlodipine to 10 mg daily for the time being, as she recently refilled this. Once she has run out, I will switch to losartan 25 mg daily. I have asked the patient to check blood pressure readings 4 times per week, at different times throughout the day, in order to get a better approximation of mean BP values. These results will be provided to me at the end of that period so that I can determine if antihypertensive medication titration is indicated.  3. Frequent PVC's/palpitations: Symptomatically stable.  4. Chest pain: This is a new symptom for her. It usually occurs at rest. I will monitor for now.   Disposition: Follow up 2 months   Prentice Docker, M.D., F.A.C.C.

## 2017-02-14 ENCOUNTER — Ambulatory Visit: Payer: BLUE CROSS/BLUE SHIELD | Admitting: Cardiovascular Disease

## 2017-03-28 ENCOUNTER — Other Ambulatory Visit (HOSPITAL_COMMUNITY): Payer: Self-pay | Admitting: *Deleted

## 2017-03-28 MED ORDER — DILTIAZEM HCL 30 MG PO TABS: ORAL_TABLET | ORAL | 0 refills | Status: DC

## 2017-04-25 ENCOUNTER — Encounter: Payer: Self-pay | Admitting: Cardiovascular Disease

## 2017-04-25 ENCOUNTER — Ambulatory Visit: Payer: BLUE CROSS/BLUE SHIELD | Admitting: Cardiovascular Disease

## 2017-04-25 ENCOUNTER — Other Ambulatory Visit: Payer: Self-pay

## 2017-04-25 VITALS — BP 126/83 | HR 80 | Ht 68.0 in | Wt 186.0 lb

## 2017-04-25 DIAGNOSIS — I493 Ventricular premature depolarization: Secondary | ICD-10-CM

## 2017-04-25 DIAGNOSIS — R635 Abnormal weight gain: Secondary | ICD-10-CM

## 2017-04-25 DIAGNOSIS — I10 Essential (primary) hypertension: Secondary | ICD-10-CM

## 2017-04-25 DIAGNOSIS — Z7182 Exercise counseling: Secondary | ICD-10-CM | POA: Diagnosis not present

## 2017-04-25 DIAGNOSIS — I480 Paroxysmal atrial fibrillation: Secondary | ICD-10-CM

## 2017-04-25 NOTE — Patient Instructions (Signed)
Your physician wants you to follow-up in: 1 YEAR WITH DR KONESWARAN You will receive a reminder letter in the mail two months in advance. If you don't receive a letter, please call our office to schedule the follow-up appointment.  Your physician recommends that you continue on your current medications as directed. Please refer to the Current Medication list given to you today.  Thank you for choosing [facility]!!    

## 2017-04-25 NOTE — Progress Notes (Signed)
SUBJECTIVE: The patient presents for follow-up of atrial fibrillation. She did not tolerate metoprolol as it made her feel sluggish and caused weight gain. Diltiazem causes restless legs. Xarelto caused heavy menses so she stopped taking this for a period of time as well. I had her referred to the atrial fibrillation clinic and she was evaluated on 11/04/16. She was told to take 30 mg of diltiazem as needed.  She has now been taking Xarelto 20 mg daily with the exception of during her menstrual cycle which is about 5 days.  She sometimes takes diltiazem 3 times per week and some weeks none at all.  She is frustrated by a progressive weight gain.  She had been stable at 170 pounds for quite some time.  She is due to have her TSH checked.  She had been 240 pounds at one time in her life.  She denies chest pain, shortness of breath, leg swelling, and palpitations. She has a lot of job-related stress.  She said she does not have time to exercise.  Social history:She is originally from [place]. She is divorced. She has a boyfriend. 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 She attended college at [employer].   Review of Systems: As per "subjective", otherwise negative.  Allergies  Allergen Reactions  . Morphine And Related Itching  . Percocet [Oxycodone-Acetaminophen]     Current Outpatient Medications  Medication Sig Dispense Refill  . amphetamine-dextroamphetamine (ADDERALL) 20 MG tablet Take 20 mg by mouth 3 (three) times daily.    . budesonide (PULMICORT) 180 MCG/ACT inhaler Inhale into the lungs 2 (two) times daily as needed.     . cyclobenzaprine (FLEXERIL) 10 MG tablet Take 10 mg by mouth 3 (three) times daily as needed for muscle spasms.    Marland Kitchen diltiazem (CARDIZEM) 30 MG tablet Take one tablet by mouth every 4 hours AS NEEDED for A-fib HR > 100 as long as BP > 100. 30 tablet 0  . fluticasone (FLONASE) 50 MCG/ACT nasal spray Place into both  nostrils daily.    Marland Kitchen HYDROcodone-acetaminophen (NORCO/VICODIN) 5-325 MG tablet Take 1 tablet by mouth every 6 (six) hours as needed for moderate pain.    Marland Kitchen losartan (COZAAR) 25 MG tablet Take 1 tablet (25 mg total) by mouth daily. 30 tablet 6  . vortioxetine HBr (TRINTELLIX) 10 MG TABS Take 1 tablet by mouth daily.     No current facility-administered medications for this visit.     Past Medical History:  Diagnosis Date  . Allergy   . Arrhythmia    afib  . Asthma   . Depression   . Hypertension     Past Surgical History:  Procedure Laterality Date  . TUBAL LIGATION      Social History   Socioeconomic History  . Marital status: Married    Spouse name: Not on file  . Number of children: Not on file  . Years of education: Not on file  . Highest education level: Not on file  Social Needs  . Financial resource strain: Not on file  . Food insecurity - worry: Not on file  . Food insecurity - inability: Not on file  . Transportation needs - medical: Not on file  . Transportation needs - non-medical: Not on file  Occupational History  . Not on file  Tobacco Use  . Smoking status: Never Smoker  . Smokeless tobacco: Never Used  Substance and Sexual Activity  . Alcohol use:  Not on file  . Drug use: No  . Sexual activity: Not on file  Other Topics Concern  . 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 Neck: No JVD, no thyromegaly. Lungs: Clear to auscultation bilaterally with normal respiratory effort. CV: Regular rate and rhythm, normal S1/S2, no S3/S4, no murmur. No pretibial or periankle edema.  No carotid bruit.   Abdomen: Soft,  nontender, no distention.  Neurologic: Alert and oriented.  Psych: Normal affect. Skin: Normal. Musculoskeletal: No gross deformities.    ECG: Most recent ECG reviewed.   Labs: Lab Results  Component Value Date/Time   K 3.6 11/04/2016 12:30 PM   BUN 15 11/04/2016 12:30 PM   CREATININE 0.77 11/04/2016 12:30 PM   ALT 25 11/04/2016 12:30 PM   TSH 1.805 11/04/2016 12:30 PM     Lipids: No results found for: LDLCALC, LDLDIRECT, CHOL, TRIG, HDL     ASSESSMENT AND PLAN:  1. Paroxysmal atrial fibrillation:  Symptomatically stable on diltiazem 30 mg as needed. We previously had a long discussion about anticoagulation. She will continue Xarelto 20 mg daily. I informed her of the increased stroke risk if she is off of anticoagulation while being in atrial fibrillation. She neither tolerated beta blockers nor long-acting diltiazem.  2. Hypertension: Blood pressure is now controlled on losartan 25 mg daily.  No changes to therapy.  3. Frequent PVC's/palpitations: Symptomatically stable.  4. Chest pain:  No recurrences.  5.  Weight gain: I provided her with exercise counseling.  I told her to try exercising 10 minutes in the morning, 10 minutes in the late afternoon when she returns from work, and 10 minutes before going to bed.  I told her to try doing this for 3 days/week initially for a few weeks and then try to increase it to 4 days/week.  I told her about plyometric exercises. She is due to have her TSH checked as well.  Her mother had hypothyroidism.     Disposition: Follow up 1 year   Prentice DockerSuresh Koneswaran, M.D., F.A.C.C.

## 2017-07-06 ENCOUNTER — Other Ambulatory Visit: Payer: Self-pay | Admitting: Cardiovascular Disease

## 2017-07-13 ENCOUNTER — Other Ambulatory Visit: Payer: Self-pay | Admitting: Cardiovascular Disease

## 2017-07-13 MED ORDER — DILTIAZEM HCL 30 MG PO TABS: ORAL_TABLET | ORAL | 2 refills | Status: DC

## 2017-07-13 NOTE — Telephone Encounter (Signed)
°*  STAT* If patient is at the pharmacy, call can be transferred to refill team.   1. Which medications need to be refilled? diltiazem (CARDIZEM) 30 MG tablet    2. Which pharmacy/location (including street and city if local pharmacy) is medication to be sent to?  [facility]   3. Do they need a 30 day or 90 day supply?

## 2017-07-13 NOTE — Telephone Encounter (Signed)
Done

## 2017-07-21 ENCOUNTER — Telehealth: Payer: Self-pay | Admitting: Cardiovascular Disease

## 2017-07-21 NOTE — Telephone Encounter (Signed)
Patient called stating that she has been having swelling from her knees to her ankles for approximately 3 weeks. . States that she is wearing compression stockings.  States that she has a rash on both legs. Patient is not having shortness of breath , chest pains .  Please call [phone].

## 2017-07-21 NOTE — Telephone Encounter (Signed)
Patient stated that legs have been swelling x 2-3 weeks now.  Come on kind of quickly.  Stated this is new for her.  Not sure about the weight gain, but definitely feels like she is retaining fluid & notices hands and fingers swelling as well.  No chest pain, dizziness, or difficulty breathing.  Normal echo on 09/29/2016.  Will fwd message to Dr. Wyline MoodBranch as Dr. Purvis SheffieldKoneswaran is out of the office all next week.

## 2017-07-24 NOTE — Telephone Encounter (Signed)
Patient notified.  Stated that she can not come tomorrow.  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  Scheduled her for Tonna CornerBritney Ortiz in [place] office for 07/27/2017 at 3:30.

## 2017-07-24 NOTE — Telephone Encounter (Signed)
Can she come and see me tomorrow in my open 2pm slot   Dominga FerryJ Robie Oats MD

## 2017-07-24 NOTE — Telephone Encounter (Signed)
Left message to return call 

## 2017-07-25 ENCOUNTER — Ambulatory Visit: Payer: BLUE CROSS/BLUE SHIELD | Admitting: Cardiology

## 2017-07-27 ENCOUNTER — Ambulatory Visit: Payer: BLUE CROSS/BLUE SHIELD | Admitting: Student

## 2017-10-20 ENCOUNTER — Other Ambulatory Visit: Payer: Self-pay | Admitting: *Deleted

## 2017-10-20 MED ORDER — LOSARTAN POTASSIUM 25 MG PO TABS: 25.0000 mg | ORAL_TABLET | Freq: Every day | ORAL | 3 refills | Status: DC

## 2017-12-04 ENCOUNTER — Other Ambulatory Visit: Payer: Self-pay | Admitting: Cardiovascular Disease

## 2018-04-19 ENCOUNTER — Emergency Department (HOSPITAL_COMMUNITY)
Admission: EM | Admit: 2018-04-19 | Discharge: 2018-04-19 | Disposition: A | Discharge status: Home / Self Care | Payer: BLUE CROSS/BLUE SHIELD | Attending: Emergency Medicine | Admitting: Emergency Medicine

## 2018-04-19 ENCOUNTER — Encounter: Payer: Self-pay | Admitting: *Deleted

## 2018-04-19 ENCOUNTER — Encounter (HOSPITAL_COMMUNITY): Payer: Self-pay | Admitting: Emergency Medicine

## 2018-04-19 ENCOUNTER — Emergency Department (HOSPITAL_COMMUNITY): Payer: BLUE CROSS/BLUE SHIELD

## 2018-04-19 ENCOUNTER — Other Ambulatory Visit: Payer: Self-pay

## 2018-04-19 ENCOUNTER — Telehealth: Payer: Self-pay | Admitting: Cardiovascular Disease

## 2018-04-19 DIAGNOSIS — I4891 Unspecified atrial fibrillation: Secondary | ICD-10-CM | POA: Insufficient documentation

## 2018-04-19 DIAGNOSIS — R9431 Abnormal electrocardiogram [ECG] [EKG]: Secondary | ICD-10-CM | POA: Diagnosis present

## 2018-04-19 DIAGNOSIS — Z79899 Other long term (current) drug therapy: Secondary | ICD-10-CM | POA: Insufficient documentation

## 2018-04-19 DIAGNOSIS — I10 Essential (primary) hypertension: Secondary | ICD-10-CM | POA: Diagnosis not present

## 2018-04-19 LAB — I-STAT TROPONIN, ED: TROPONIN I, POC: 0.01 ng/mL (ref 0.00–0.08)

## 2018-04-19 LAB — BASIC METABOLIC PANEL
Anion gap: 10 (ref 5–15)
BUN: 17 mg/dL (ref 6–20)
CO2: 26 mmol/L (ref 22–32)
CREATININE: 1.14 mg/dL — AB (ref 0.44–1.00)
Calcium: 9.2 mg/dL (ref 8.9–10.3)
Chloride: 103 mmol/L (ref 98–111)
GFR, EST NON AFRICAN AMERICAN: 57 mL/min — AB (ref >60)
Glucose, Bld: 107 mg/dL — ABNORMAL HIGH (ref 70–99)
Potassium: 4.5 mmol/L (ref 3.5–5.1)
SODIUM: 139 mmol/L (ref 135–145)

## 2018-04-19 LAB — CBC
HCT: 46.1 % — ABNORMAL HIGH (ref 36.0–46.0)
Hemoglobin: 14.9 g/dL (ref 12.0–15.0)
MCH: 29.3 pg (ref 26.0–34.0)
MCHC: 32.3 g/dL (ref 30.0–36.0)
MCV: 90.7 fL (ref 80.0–100.0)
NRBC: 0 % (ref 0.0–0.2)
PLATELETS: 290 10*3/uL (ref 150–400)
RBC: 5.08 MIL/uL (ref 3.87–5.11)
RDW: 13.1 % (ref 11.5–15.5)
WBC: 9.9 10*3/uL (ref 4.0–10.5)

## 2018-04-19 LAB — I-STAT BETA HCG BLOOD, ED (MC, WL, AP ONLY): I-stat hCG, quantitative: <5 m[IU]/mL (ref <5)

## 2018-04-19 MED ORDER — SODIUM CHLORIDE 0.9 % IV BOLUS
1000.0000 mL | Freq: Once | INTRAVENOUS | Status: AC
Start: 2018-04-19 — End: 2018-04-19
  Administered 2018-04-19: 1000 mL via INTRAVENOUS

## 2018-04-19 NOTE — Telephone Encounter (Signed)
Patient said her PCP request that she go to the ED for an evaluation based on her abnormal EKG findings today. Patient says that she feels fine and didn't feel she need to go to the ED. No c/o sob, chest pain or dizziness. Patient advised that since her PCP evaluated her today that she should follow their recommendations. Patient given the first available appointment since she is due to see Dr. Kirtland Bouchard this month. Encouraged patient to go to the ED as directed by her PCP based on their findings today. Verbalized understanding of plan. Records requested from PCP.

## 2018-04-19 NOTE — ED Provider Notes (Signed)
[facility] Provider Note   CSN: [id number] Arrival date & time: 04/19/18  1725     History   Chief Complaint Chief Complaint  Patient presents with  . Abnormal ECG    HPI Dana Harriesammy Keisler is a 48 y.o. female.  Dana Ortiz is a 48 y.o. female with a history of paroxysmal A. fib, asthma, hypertension and depression, who presents to the emergency department for evaluation of abnormal EKG.  [facility] initial evaluation and consultation.  She reports that as part of their consult they performed an EKG which showed that she was in A. fib with an elevated heart rate, she reports that on the EKG report there was some mention that it could not rule out a inferior MI so she was instructed to present to the emergency department for further evaluation.  She denies any associated chest pain, no shortness of breath, no pain in the arm neck or jaw.  No lightheadedness or dizziness, no nausea, vomiting or diaphoresis and no abdominal pain.  Denies any lower extremity swelling or pain.  She reports she was diagnosed with A. fib about 2 years ago and is followed by Dr. Purvis SheffieldKoneswaran with cardiology in [place].  She reports that she was prescribed Cardizem to take as a "pill in the pocket" when she feels like her heart is beating fast.  She reports that she frequently does not feel when she is in A. fib, was also prescribed losartan and Xarelto, but she reports these medications made her feel tired and rundown and so she stopped taking them, is not on any other blood thinners and has not discussed with her cardiologist a different medication to try as an alternative. Reports that this afternoon just prior to arriving in the emergency department she did take one 30 mg tablet of Cardizem.     Past Medical History:  Diagnosis Date  . Allergy   . Arrhythmia    afib  . Asthma   . Depression   . Hypertension      There are no active problems to display for this patient.   Past Surgical History:  Procedure Laterality Date  . TUBAL LIGATION       OB History   No obstetric history on file.      Home Medications    Prior to Admission medications   Medication Sig Start Date End Date Taking? Authorizing Provider  amphetamine-dextroamphetamine (ADDERALL) 20 MG tablet Take 20 mg by mouth See admin instructions. Take 20 mg by mouth one to two times a day   Yes [provider]  budesonide (PULMICORT) 180 MCG/ACT inhaler Inhale into the lungs 2 (two) times daily as needed.     [provider]  cyclobenzaprine (FLEXERIL) 10 MG tablet Take 10 mg by mouth 3 (three) times daily as needed for muscle spasms.    [provider]  diltiazem (CARDIZEM) 30 MG tablet Take one tablet by mouth every 4 hours AS NEEDED for A-fib HR > 100 as long as BP > 100. 07/13/17   Laqueta LindenKoneswaran, Suresh A, MD  fluticasone (FLONASE) 50 MCG/ACT nasal spray Place into both nostrils daily.    [provider]  HYDROcodone-acetaminophen (NORCO/VICODIN) 5-325 MG tablet Take 1 tablet by mouth every 6 (six) hours as needed for moderate pain.    [provider]  losartan (COZAAR) 25 MG tablet Take 1 tablet (25 mg total) by mouth daily.  10/20/17   Laqueta Linden, MD  vortioxetine HBr (TRINTELLIX) 10 MG TABS Take 1 tablet by mouth daily. 11/28/16   [provider]  XARELTO 20 MG TABS tablet TAKE ONE TABLET BY MOUTH DAILY WITH SUPPER (PLEASE DO NOT FILL UNTIL PATIENT REQUEST) 12/05/17   Laqueta Linden, MD    Family History Family History  Problem Relation Age of Onset  . Diabetes Mother   . Glaucoma Mother   . Heart Problems Brother     Social History Social History   Tobacco Use  . Smoking status: Never Smoker  . Smokeless tobacco: Never Used  Substance Use Topics  . Alcohol use: Yes    Alcohol/week: 7.0 standard drinks    Types: 7 Glasses of wine per week  . Drug  use: No     Allergies   Morphine and related and Percocet [oxycodone-acetaminophen]   Review of Systems Review of Systems   Physical Exam Updated Vital Signs BP (!) 141/71   Pulse 96   Temp 97.9 F (36.6 C) (Oral)   Resp 14   Ht 5' 7.5" (1.715 m)   Wt 93 kg   SpO2 95%   BMI 31.63 kg/m   Physical Exam Vitals signs and nursing note reviewed.  Constitutional:      General: She is not in acute distress.    Appearance: Normal appearance. She is well-developed. She is not ill-appearing or diaphoretic.  HENT:     Head: Normocephalic and atraumatic.     Mouth/Throat:     Mouth: Mucous membranes are moist.     Pharynx: Oropharynx is clear.  Eyes:     General:        Right eye: No discharge.        Left eye: No discharge.  Neck:     Musculoskeletal: Neck supple.  Cardiovascular:     Rate and Rhythm: Normal rate. Rhythm irregular.     Pulses: Normal pulses.     Heart sounds: Normal heart sounds. No murmur. No friction rub.  Pulmonary:     Effort: Pulmonary effort is normal. No respiratory distress.     Breath sounds: Normal breath sounds.     Comments: Respirations equal and unlabored, patient able to speak in full sentences, lungs clear to auscultation bilaterally Abdominal:     General: Abdomen is flat. Bowel sounds are normal. There is no distension.     Palpations: Abdomen is soft. There is no mass.     Tenderness: There is no abdominal tenderness. There is no guarding.     Comments: Abdomen soft, nondistended, nontender to palpation in all quadrants without guarding or peritoneal signs  Musculoskeletal:     Right lower leg: No edema.     Left lower leg: No edema.  Skin:    General: Skin is warm and dry.     Capillary Refill: Capillary refill takes less than 2 seconds.  Neurological:     Mental Status: She is alert and oriented to person, place, and time. Mental status is at baseline.     Coordination: Coordination normal.  Psychiatric:        Mood and Affect:  Mood normal.        Behavior: Behavior normal.      ED Treatments / Results  Labs (all labs ordered are listed, but only abnormal results are displayed) Labs Reviewed  BASIC METABOLIC PANEL - Abnormal; Notable for the following components:      Result Value   Glucose, Bld  107 (*)    Creatinine, Ser 1.14 (*)    GFR calc non Af Amer 57 (*)    All other components within normal limits  CBC - Abnormal; Notable for the following components:   HCT 46.1 (*)    All other components within normal limits  I-STAT TROPONIN, ED  I-STAT BETA HCG BLOOD, ED ([facility], [facility], [facility] ONLY)    EKG EKG Interpretation  Date/Time:  Thursday April 19 2018 17:32:51 EST Ventricular Rate:  122 PR Interval:    QRS Duration: 84 QT Interval:  328 QTC Calculation: 467 R Axis:   22 Text Interpretation:  Atrial fibrillation with rapid ventricular response Anterior infarct , age undetermined Abnormal ECG changed from prior EKG Confirmed by Rolan Bucco [id number]) on 04/19/2018 6:27:22 PM   Radiology Dg Chest 2 View  Result Date: 04/19/2018 CLINICAL DATA:  Confusion EXAM: CHEST - 2 VIEW COMPARISON:  None. FINDINGS: Mild cardiomegaly. No acute airspace disease or effusion. No pneumothorax. IMPRESSION: No active cardiopulmonary disease.  Mild cardiomegaly Electronically Signed   By: Jasmine Pang M.D.   On: 04/19/2018 18:27    Procedures Procedures (including critical care time)  Medications Ordered in ED Medications  sodium chloride 0.9 % bolus 1,000 mL (0 mLs Intravenous Stopped 04/19/18 2100)     Initial Impression / Assessment and Plan / ED Course  I have reviewed the triage vital signs and the nursing notes.  Pertinent labs & imaging results that were available during my care of the patient were reviewed by me and considered in my medical decision making (see chart for details).  Patient presents to the emergency department for evaluation of abnormal EKG.  History of A. fib, sent in today from weight loss  clinic with EKG showing A. fib with RVR, with possible anterior infarct.  Patient is completely asymptomatic with this elevated heart rate, she denies any palpitations, chest pain or shortness of breath, no lightheadedness or syncope.  Patient took home dose of Cardizem which she takes as needed just prior to arrival and on my evaluation heart rate has already significantly improved, and is now ranging between the 90s and 110s.  Labs initiated from triage, EKG without significant changes when compared to previous.  No leukocytosis, normal hemoglobin, no acute electrolyte derangements, creatinine is slightly elevated from baseline at 1.14, negative pregnancy and negative troponin.  Chest x-ray shows no active cardiopulmonary disease, mild cardiomegaly.  Give 1 L fluid boluses will help with elevated creatinine as well as patient's heart rate and will continue to monitor.  We will hold off on giving any further rate controlling agents and give patient's diltiazem time to work.  This patients CHA2DS2-VASc Score and unadjusted Ischemic Stroke Rate (% per year) is equal to 2.2 % stroke rate/year from a score of 2.  Patient does report that she discontinued her Xarelto and losartan that was prescribed to her by her cardiologist because she reported feeling fatigued and tired while on these medications, she stopped both of them at the same time, and I have discussed with the patient and she is agreeable with restarting her Xarelto once daily as directed and following closely with her cardiologist or primary care doctor to discuss other options for blood pressure management.     After fluids Pt maintaining heart rate in the 70s, ambulatory in the department with no further tachycardia.  At this time feel patient is stable for discharge home, she will continue to use her Cardizem as needed, offered prescription for Xarelto  patient reports she has plenty at home will begin taking this again once daily and discuss with her  primary cardiologist for further management.  Return precautions discussed.  Patient expresses understanding and is in agreement with plan.  Final Clinical Impressions(s) / ED Diagnoses   Final diagnoses:  Atrial fibrillation with RVR [facility](HCC)    ED Discharge Orders    None       Legrand RamsFord, Daelynn Blower N, PA-C 04/19/18 2309    Rolan BuccoBelfi, Melanie, MD 04/19/18 2324

## 2018-04-19 NOTE — ED Triage Notes (Signed)
Patient went to a weight loss clinic today for evaluation/consultation. Patient has no complaints and EKG was taken and sent to the ED for evaluation. Denies chest pain or shortness of breath. Patient states has a history of A-Fib for 2 years.

## 2018-04-19 NOTE — ED Notes (Signed)
Patient verbalizes understanding of discharge instructions. Opportunity for questioning and answers were provided. Armband removed by staff, pt discharged from ED ambulatory.   

## 2018-04-19 NOTE — Telephone Encounter (Signed)
Patient called stating that was seen at [facility] today States that they did an EKG . She was advised to go to [employer]. She states that she feels fine and did not go to the ER.

## 2018-04-19 NOTE — ED Notes (Signed)
With ambulation, pts HRremained consistently in the mid-70s. Pt reports no chest pain, shortness of breath, or discomfort.   Pt ambulated without difficulty, good stability. Pt in the bathroom changing clothes for d/c at this time.

## 2018-04-19 NOTE — Discharge Instructions (Signed)
Your work-up today is reassuring.  Please begin taking your Eliquis once daily with a meal as directed and follow-up with your primary care doctor to discuss continued medical management of your A. fib.  Return to the emergency department if you develop chest pain, shortness of breath, lightheadedness or feeling like you may pass out, severe palpitations or any other new or concerning symptoms.

## 2018-04-27 ENCOUNTER — Other Ambulatory Visit: Payer: Self-pay | Admitting: Cardiovascular Disease

## 2018-05-22 ENCOUNTER — Telehealth: Payer: Self-pay | Admitting: Cardiovascular Disease

## 2018-05-22 NOTE — Telephone Encounter (Signed)
Patient called stating that she has been sick with a virus and needs to re-schedule. She states that her heart continues to race. Fatigue and shortness of breath. Next appt is scheduled for 06/20/2018.  Patient was seen in ER [facility]  05-13-2018.   [phone])

## 2018-05-23 ENCOUNTER — Ambulatory Visit: Payer: BLUE CROSS/BLUE SHIELD | Admitting: Cardiovascular Disease

## 2018-05-29 NOTE — Telephone Encounter (Signed)
Left multiple messages to return call  

## 2018-06-20 ENCOUNTER — Ambulatory Visit: Payer: BLUE CROSS/BLUE SHIELD | Admitting: Cardiovascular Disease

## 2018-07-18 ENCOUNTER — Other Ambulatory Visit: Payer: Self-pay | Admitting: Cardiovascular Disease

## 2018-10-21 ENCOUNTER — Other Ambulatory Visit: Payer: Self-pay | Admitting: Cardiovascular Disease

## 2018-10-23 ENCOUNTER — Other Ambulatory Visit: Payer: Self-pay | Admitting: Family Medicine

## 2018-10-23 ENCOUNTER — Other Ambulatory Visit (HOSPITAL_COMMUNITY): Payer: Self-pay | Admitting: Family Medicine

## 2018-10-23 DIAGNOSIS — K760 Fatty (change of) liver, not elsewhere classified: Secondary | ICD-10-CM | POA: Insufficient documentation

## 2018-10-23 DIAGNOSIS — R112 Nausea with vomiting, unspecified: Secondary | ICD-10-CM

## 2018-10-23 DIAGNOSIS — R1011 Right upper quadrant pain: Secondary | ICD-10-CM

## 2018-10-24 ENCOUNTER — Ambulatory Visit (HOSPITAL_COMMUNITY)
Admission: RE | Admit: 2018-10-24 | Discharge: 2018-10-24 | Disposition: A | Discharge status: Home / Self Care | Payer: Self-pay | Source: Ambulatory Visit | Attending: Family Medicine | Admitting: Family Medicine

## 2018-10-24 ENCOUNTER — Other Ambulatory Visit: Payer: Self-pay

## 2018-10-24 DIAGNOSIS — K760 Fatty (change of) liver, not elsewhere classified: Secondary | ICD-10-CM | POA: Insufficient documentation

## 2018-10-24 DIAGNOSIS — R1011 Right upper quadrant pain: Secondary | ICD-10-CM | POA: Insufficient documentation

## 2018-10-24 DIAGNOSIS — R112 Nausea with vomiting, unspecified: Secondary | ICD-10-CM | POA: Insufficient documentation

## 2018-10-26 ENCOUNTER — Other Ambulatory Visit: Payer: Self-pay | Admitting: Cardiovascular Disease

## 2018-11-01 ENCOUNTER — Encounter: Payer: Self-pay | Admitting: Internal Medicine

## 2018-11-02 ENCOUNTER — Other Ambulatory Visit (HOSPITAL_COMMUNITY): Payer: Self-pay | Admitting: Adult Health Nurse Practitioner

## 2018-11-02 DIAGNOSIS — R1011 Right upper quadrant pain: Secondary | ICD-10-CM

## 2018-11-06 ENCOUNTER — Encounter (HOSPITAL_COMMUNITY): Payer: Self-pay

## 2018-11-13 ENCOUNTER — Ambulatory Visit: Payer: Self-pay | Admitting: Gastroenterology

## 2018-11-13 ENCOUNTER — Telehealth: Payer: Self-pay | Admitting: Internal Medicine

## 2018-11-13 ENCOUNTER — Encounter: Payer: Self-pay | Admitting: Internal Medicine

## 2018-11-13 NOTE — Telephone Encounter (Signed)
Patient was a no show and letter sent  °

## 2018-11-15 ENCOUNTER — Other Ambulatory Visit: Payer: Self-pay | Admitting: Cardiovascular Disease

## 2018-12-10 ENCOUNTER — Other Ambulatory Visit: Payer: Self-pay | Admitting: Cardiovascular Disease

## 2019-01-30 ENCOUNTER — Telehealth: Payer: Self-pay | Admitting: Cardiovascular Disease

## 2019-01-30 MED ORDER — RIVAROXABAN 20 MG PO TABS: ORAL_TABLET | ORAL | 0 refills | Status: DC

## 2019-01-30 NOTE — Telephone Encounter (Signed)
Patient calling the office for samples of medication:   1.  What medication and dosage are you requesting samples for?   XARELTO 20 MG TABS tablet    2.  Are you currently out of this medication?    Patient has lost her insurance

## 2019-01-30 NOTE — Telephone Encounter (Signed)
Samples are available for pickup also gave pt assistance application - also was made a f/u appt as pt is past due

## 2019-02-21 ENCOUNTER — Telehealth: Payer: Self-pay | Admitting: *Deleted

## 2019-02-21 NOTE — Telephone Encounter (Signed)
Received fax from [facility] patient assistance foundation - patient has been approved for Xarelto assistance for up to one year.

## 2019-02-27 ENCOUNTER — Encounter: Payer: Self-pay | Admitting: Cardiovascular Disease

## 2019-02-27 ENCOUNTER — Telehealth (INDEPENDENT_AMBULATORY_CARE_PROVIDER_SITE_OTHER): Payer: No Typology Code available for payment source | Admitting: Cardiovascular Disease

## 2019-02-27 VITALS — BP 128/84 | HR 63 | Ht 68.0 in | Wt 195.0 lb

## 2019-02-27 DIAGNOSIS — I10 Essential (primary) hypertension: Secondary | ICD-10-CM

## 2019-02-27 DIAGNOSIS — I480 Paroxysmal atrial fibrillation: Secondary | ICD-10-CM

## 2019-02-27 NOTE — Patient Instructions (Signed)

## 2019-02-27 NOTE — Progress Notes (Signed)
Virtual Visit via Telephone Note   This visit type was conducted due to national recommendations for restrictions regarding the COVID-19 Pandemic (e.g. social distancing) in an effort to limit this patient's exposure and mitigate transmission in our community.  Due to her co-morbid illnesses, this patient is at least at moderate risk for complications without adequate follow up.  This format is felt to be most appropriate for this patient at this time.  The patient did not have access to video technology/had technical difficulties with video requiring transitioning to audio format only (telephone).  All issues noted in this document were discussed and addressed.  No physical exam could be performed with this format.  Please refer to the patient's chart for her  consent to telehealth for [facility].   Date:  02/27/2019   ID:  Dana Ortiz, DOB Apr 30, 1970, MRN [id number]  Patient Location: Home Provider Location: Office  PCP:  Rebecka Apley, NP  Cardiologist:  Prentice Docker, MD  Electrophysiologist:  None   Evaluation Performed:  Follow-Up Visit  Chief Complaint: Atrial fibrillation  History of Present Illness:    Dana Ortiz is a 48 y.o. female with paroxysmal atrial fibrillation.  Previously, she She did not tolerate metoprolol as it made her feel sluggish and caused weight gain. Diltiazem causes restless legs. Xarelto caused heavy menses so she stopped taking this for a period of timeas well. I had her referred to the atrial fibrillation clinic and she was evaluated on 11/04/16. She was told to take 30 mg of diltiazem as needed.  She has now been taking Xarelto 20mg  daily with the exception of during her menstrual cycle which is about 5 days.  She denies palpitations and has not used diltiazem since the summer 2019.  She retired from [employer] in July. She is without insurance. Xarelto is too expensive so she has been using samples.  She has applied for patient  assistance.  Her PCP increased losartan to 25 mg bid as BP was 140/90.   Social history:She is originally from [place]. She is divorced. She has a boyfriend. 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 She attended college at [employer].   Past Medical History:  Diagnosis Date  . Allergy   . Arrhythmia    afib  . Asthma   . Depression   . Hypertension    Past Surgical History:  Procedure Laterality Date  . TUBAL LIGATION       Current Meds  Medication Sig  . amphetamine-dextroamphetamine (ADDERALL) 20 MG tablet Take 20 mg by mouth See admin instructions. Take 20 mg by mouth one to two times a day  . Coenzyme Q10 (CO Q-10 PO) Take 1 capsule by mouth daily.  . cyclobenzaprine (FLEXERIL) 10 MG tablet Take 10 mg by mouth 3 (three) times daily as needed for muscle spasms.  . fluticasone (FLONASE) 50 MCG/ACT nasal spray Place 2 sprays into both nostrils daily as needed for allergies or rhinitis.   [employer] losartan (COZAAR) 25 MG tablet Take 25 mg by mouth 2 (two) times daily.  . Magnesium 250 MG TABS Take 250 mg by mouth daily.  . Multiple Vitamins-Calcium (ONE-A-DAY WOMENS PO) Take 1 tablet by mouth daily.   . NON FORMULARY Take 2-5 capsules by mouth See admin instructions. Kratom capsules: Take 5 capsules by mouth in the morning and an additional 2-5 capsules in the evening as needed to promote focusing     Allergies:   Morphine  and related and Percocet [oxycodone-acetaminophen]   Social History   Tobacco Use  . Smoking status: Never Smoker  . Smokeless tobacco: Never Used  Substance Use Topics  . Alcohol use: Yes    Alcohol/week: 7.0 standard drinks    Types: 7 Glasses of wine per week  . Drug use: No     Family Hx: The patient's family history includes Diabetes in her mother; Glaucoma in her mother; Heart Problems in her brother.  ROS:   Please see the history of present illness.     All other systems reviewed and are  negative.   Prior CV studies:   The following studies were reviewed today:  NA  Labs/Other Tests and Data Reviewed:    EKG:  No ECG reviewed.  Recent Labs: 04/19/2018: BUN 17; Creatinine, Ser 1.14; Hemoglobin 14.9; Platelets 290; Potassium 4.5; Sodium 139   Recent Lipid Panel No results found for: CHOL, TRIG, HDL, CHOLHDL, LDLCALC, LDLDIRECT  Wt Readings from Last 3 Encounters:  02/27/19 195 lb (88.5 kg)  04/19/18 205 lb (93 kg)  04/25/17 186 lb (84.4 kg)     Objective:    Vital Signs:  BP 128/84   Pulse 63   Ht 5\' 8"  (1.727 m)   Wt 195 lb (88.5 kg)   BMI 29.65 kg/m    VITAL SIGNS:  reviewed  ASSESSMENT & PLAN:    1. Paroxysmal atrial fibrillation: Symptomatically stable on diltiazem 30 mg as needed and has been without palpitations in over a year.  We previously had a long discussion about anticoagulation. She will continue Xarelto 20 mg daily. I informed her of the increased stroke risk if she is off of anticoagulation while being in atrial fibrillation. She neither tolerated beta blockers nor long-acting diltiazem.  She has applied for patient assistance for Xarelto as she is without insurance.  2. Hypertension: Controlled on losartan 25 mg twice daily.  No changes to therapy.   COVID-19 Education: The signs and symptoms of COVID-19 were discussed with the patient and how to seek care for testing (follow up with PCP or arrange E-visit).  The importance of social distancing was discussed today.  Time:   Today, I have spent 10 minutes with the patient with telehealth technology discussing the above problems.     Medication Adjustments/Labs and Tests Ordered: Current medicines are reviewed at length with the patient today.  Concerns regarding medicines are outlined above.   Tests Ordered: No orders of the defined types were placed in this encounter.   Medication Changes: No orders of the defined types were placed in this encounter.   Follow Up:  Either In  Person or Virtual in 1 year(s)  Signed, Kate Sable, MD  02/27/2019 8:39 AM    [place]

## 2019-03-04 ENCOUNTER — Other Ambulatory Visit: Payer: Self-pay | Admitting: Cardiovascular Disease

## 2019-03-04 MED ORDER — RIVAROXABAN 20 MG PO TABS: ORAL_TABLET | ORAL | 6 refills | Status: DC

## 2019-03-04 NOTE — Telephone Encounter (Signed)
Got approved for Dana Ortiz Asked that a Rx be sent into [facility] in [place]

## 2019-10-16 ENCOUNTER — Other Ambulatory Visit: Payer: Self-pay | Admitting: *Deleted

## 2019-10-16 MED ORDER — RIVAROXABAN 20 MG PO TABS: ORAL_TABLET | ORAL | 6 refills | Status: DC

## 2020-01-05 IMAGING — CR DG CHEST 2V
2 series · 2 of 2 positions shown · non-contrast
Comparison: None.

CLINICAL DATA: Confusion

EXAM:
CHEST - 2 VIEW

[chest pa]
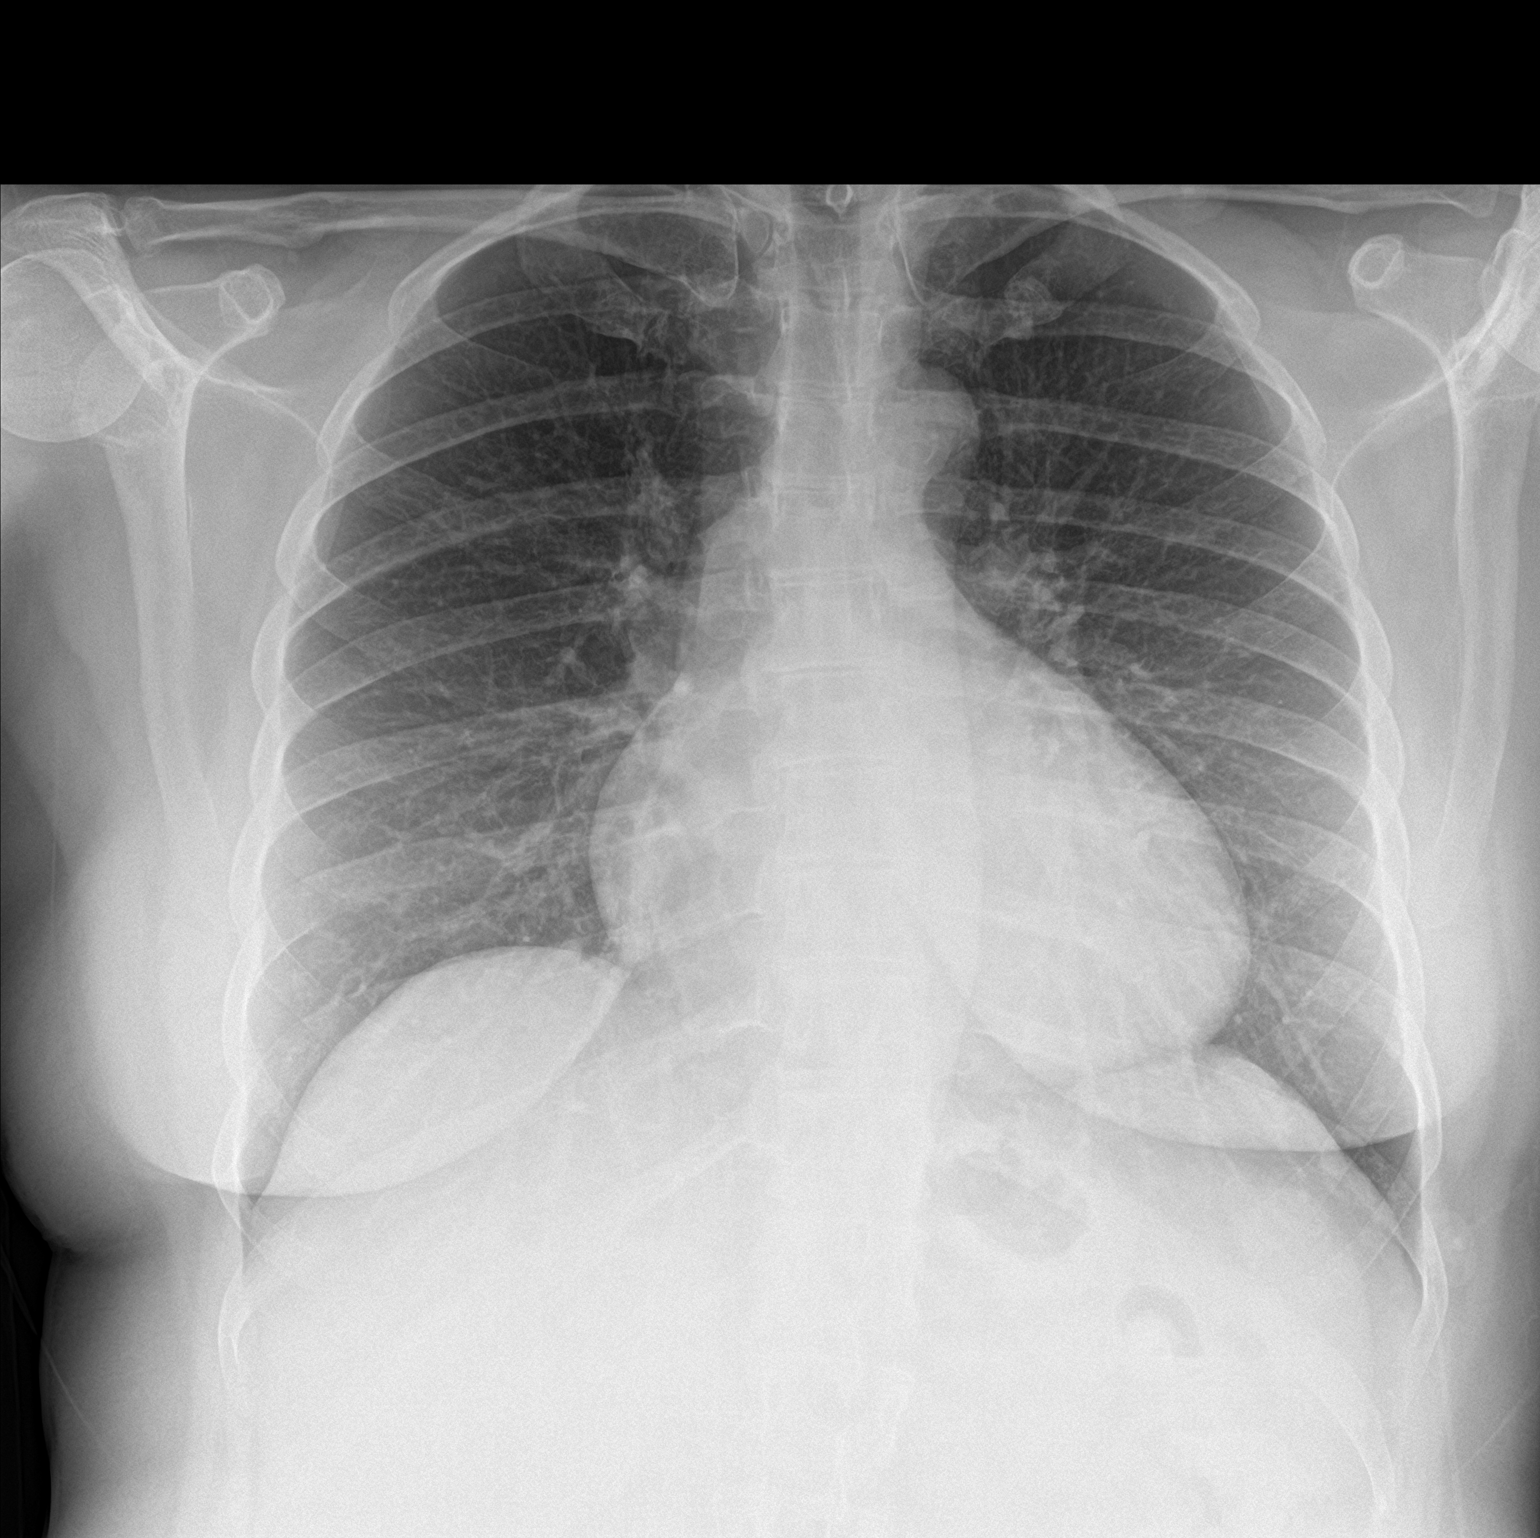

[chest lat]
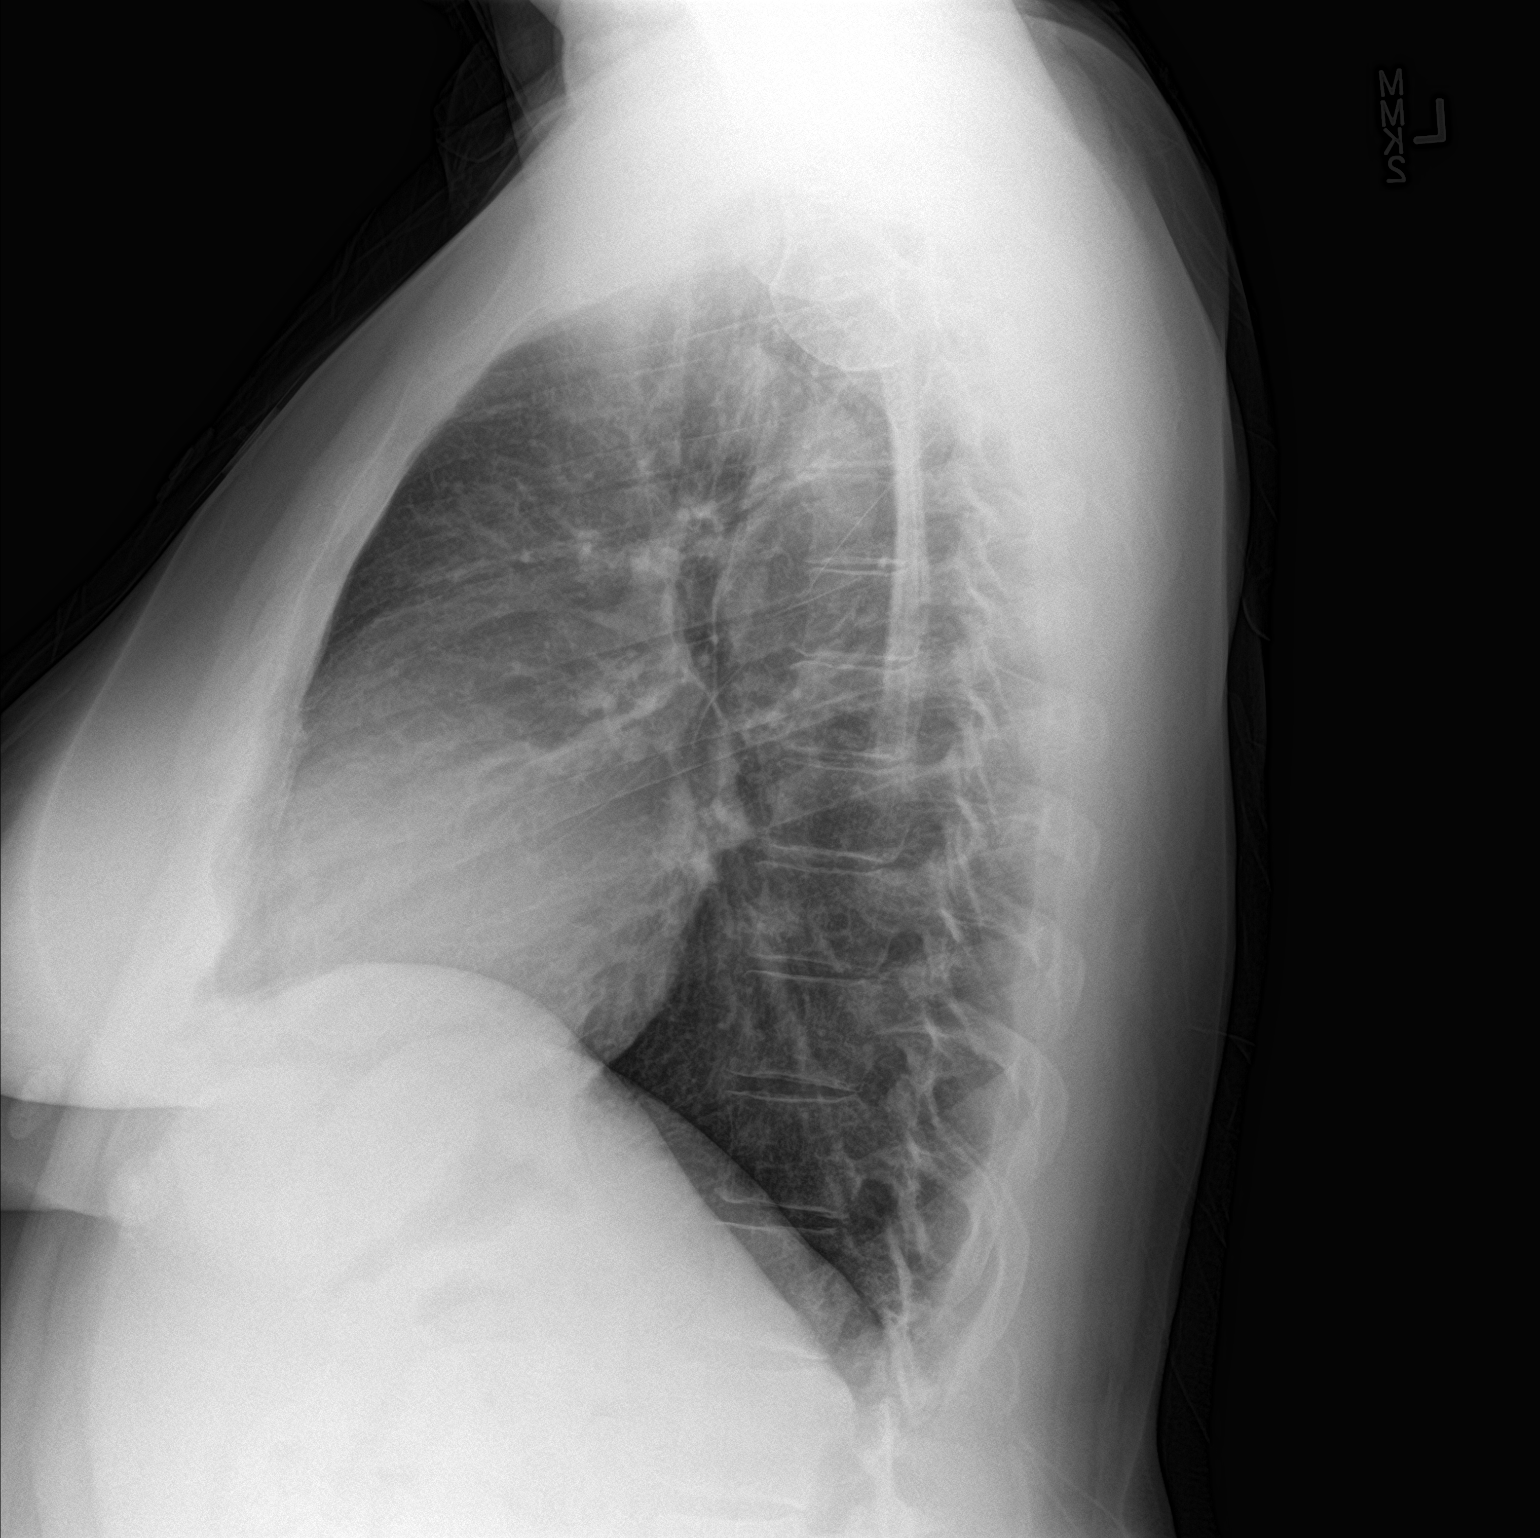

[2 of 2 positions shown; findings below may reference images not displayed]

FINDINGS: Mild cardiomegaly. No acute airspace disease or effusion. No
pneumothorax.
IMPRESSION: No active cardiopulmonary disease.  Mild cardiomegaly

## 2020-03-19 ENCOUNTER — Telehealth (HOSPITAL_COMMUNITY): Payer: Self-pay | Admitting: *Deleted

## 2020-03-19 NOTE — Telephone Encounter (Signed)
Already received and reached out to pt for recall - will forward to schedulers to f/u on pt appt

## 2020-03-19 NOTE — Telephone Encounter (Signed)
Received cover my meds Preauthorization request for Xarelto . Pt hasn't been seen here in over 3 years follows with Dr. Neill Loft (in 2020) Key: [id number]. Will forward to [facility] office for completion. Also due for recall with [facility] MD in December.

## 2020-05-12 ENCOUNTER — Ambulatory Visit (INDEPENDENT_AMBULATORY_CARE_PROVIDER_SITE_OTHER): Payer: Medicaid - Out of State | Admitting: Cardiology

## 2020-05-12 ENCOUNTER — Other Ambulatory Visit: Payer: Self-pay

## 2020-05-12 ENCOUNTER — Encounter: Payer: Self-pay | Admitting: Cardiology

## 2020-05-12 ENCOUNTER — Other Ambulatory Visit: Payer: Self-pay | Admitting: *Deleted

## 2020-05-12 VITALS — BP 124/80 | HR 70 | Ht 68.0 in | Wt 202.0 lb

## 2020-05-12 DIAGNOSIS — I10 Essential (primary) hypertension: Secondary | ICD-10-CM | POA: Diagnosis not present

## 2020-05-12 DIAGNOSIS — I480 Paroxysmal atrial fibrillation: Secondary | ICD-10-CM

## 2020-05-12 DIAGNOSIS — Z79899 Other long term (current) drug therapy: Secondary | ICD-10-CM

## 2020-05-12 DIAGNOSIS — I4819 Other persistent atrial fibrillation: Secondary | ICD-10-CM | POA: Diagnosis not present

## 2020-05-12 NOTE — Patient Instructions (Signed)
Medication Instructions:   Your physician recommends that you continue on your current medications as directed. Please refer to the Current Medication list given to you today.  Labwork:  Your physician recommends that you return for lab work in: TODAY to check your BMET & CBC. This may be done at [facility].  Testing/Procedures: Your physician has requested that you have an echocardiogram. Echocardiography is a painless test that uses sound waves to create images of your heart. It provides your doctor with information about the size and shape of your heart and how well your heart's chambers and valves are working. This procedure takes approximately one hour. There are no restrictions for this procedure.  Follow-Up:  Your physician recommends that you schedule a follow-up appointment in: 3 months  Refer to A-fib Clinic  Any Other Special Instructions Will Be Listed Below (If Applicable).  If you need a refill on your cardiac medications before your next appointment, please call your pharmacy.

## 2020-05-12 NOTE — Progress Notes (Signed)
Cardiology Office Note  Date: 05/12/2020   ID: Dana Ortiz, DOB [birth year], MRN [id number]  PCP:  Rebecka Apley, NP  Cardiologist:  Nona Dell, MD Electrophysiologist:  None   Chief Complaint  Patient presents with  . Cardiac follow-up    History of Present Illness: Dana Ortiz is a 50 y.o. female former patient of Dr. Purvis Sheffield now presenting to establish follow-up with me.  I reviewed her records and updated the chart.  She was last assessed via telehealth encounter back in November 2020.   She is described as having had paroxysmal atrial fibrillation based on the last note by Dr. Purvis Sheffield, it seems that she has had persistent atrial fibrillation for quite some time based on discussion today.  She states that she has fatigue, short of breath with activity, this has been going on for several months, and she gets regular alerts about atrial fibrillation by her Apple Watch.  Chart indicates prior intolerance of metoprolol (fatigue and weight gain), also restless legs with diltiazem.  It had been previously recommended that she use short acting diltiazem for palpitations when she was seen in the atrial fibrillation clinic back in 2018.  She states that she did not tolerate this.  CHA2DS2-VASc score is 2.  She does report compliance with Xarelto, no obvious bleeding problems.  She has not had recent lab work.  I personally reviewed her ECG today which shows rate controlled atrial fibrillation with nonspecific ST-T changes.  Today we discussed various issues including antiarrhythmic therapy and potential evaluation for ablation.  Also plan for follow-up lab work and a repeat echocardiogram.  Past Medical History:  Diagnosis Date  . Asthma   . Depression   . Essential hypertension   . Paroxysmal atrial fibrillation [facility])     Past Surgical History:  Procedure Laterality Date  . TUBAL LIGATION      Current Outpatient Medications  Medication Sig Dispense Refill   . amphetamine-dextroamphetamine (ADDERALL) 20 MG tablet Take 20 mg by mouth See admin instructions. Take 20 mg by mouth one to two times a day    . citalopram (CELEXA) 20 MG tablet Take 20 mg by mouth daily.    . Coenzyme Q10 (CO Q-10 PO) Take 1 capsule by mouth daily.    . fluticasone (FLONASE) 50 MCG/ACT nasal spray Place 2 sprays into both nostrils daily as needed for allergies or rhinitis.     Marland Kitchen losartan (COZAAR) 100 MG tablet Take 100 mg by mouth daily.    . magnesium gluconate (MAGONATE) 500 MG tablet Take 500 mg by mouth daily.    . Multiple Vitamins-Calcium (ONE-A-DAY WOMENS PO) Take 1 tablet by mouth daily.     . NON FORMULARY Take 2-5 capsules by mouth See admin instructions. Kratom capsules: Take 5 capsules by mouth in the morning and an additional 2-5 capsules in the evening as needed to promote focusing    . rivaroxaban (XARELTO) 20 MG TABS tablet TAKE 1 TABLET BY MOUTH DAILY WITH SUPPER 30 tablet 6   No current facility-administered medications for this visit.   Allergies:  Morphine and related and Percocet [oxycodone-acetaminophen]   Social History: The patient  reports that she has never smoked. She has never used smokeless tobacco. She reports current alcohol use of about 7.0 standard drinks of alcohol per week. She reports that she does not use drugs.   Family History: The patient's family history includes Diabetes in her mother; Glaucoma in her mother; Heart Problems in her brother.  ROS: No syncope.  Physical Exam: VS:  BP 124/80   Pulse 70   Ht 5\' 8"  (1.727 m)   Wt 202 lb (91.6 kg)   SpO2 98%   BMI 30.71 kg/m , BMI Body mass index is 30.71 kg/m.  Wt Readings from Last 3 Encounters:  05/12/20 202 lb (91.6 kg)  02/27/19 195 lb (88.5 kg)  04/19/18 205 lb (93 kg)    General: Patient appears comfortable at rest. HEENT: Conjunctiva and lids normal, wearing a mask. Neck: Supple, no elevated JVP. Lungs: Clear to auscultation, nonlabored breathing at  rest. Cardiac: Irregularly irregular, no S3 or significant systolic murmur, no pericardial rub. Extremities: No pitting edema. Skin: Warm and dry. Musculoskeletal: No kyphosis. Neuropsychiatric: Alert and oriented x3, affect grossly appropriate.  ECG:  An ECG dated 04/19/2018 was personally reviewed today and demonstrated:  Atrial fibrillation with RVR, decreased R wave progression.  Recent Labwork:  January 2020: Potassium 4.5, BUN 17, creatinine 1.14, hemoglobin 14.9, platelets 290  Other Studies Reviewed Today:  Echocardiogram 09/29/2016: - Left ventricle: The cavity size was normal. Wall thickness was  increased increased in a pattern of mild to moderate LVH.  Systolic function was normal. The estimated ejection fraction was  in the range of 55% to 60%. Wall motion was normal; there were no  regional wall motion abnormalities. The study is not technically  sufficient to allow evaluation of LV diastolic function.  - Mitral valve: There was mild regurgitation.  - Right atrium: Central venous pressure (est): 3 mm Hg.  - Atrial septum: No defect or patent foramen ovale was identified.  - Tricuspid valve: There was trivial regurgitation.  - Pulmonary arteries: Systolic pressure could not be accurately  estimated.  - Pericardium, extracardiac: There was no pericardial effusion.   Impressions:   - Mild to moderate LVH with LVEF 55-60%. Indeterminate diastolic  function in the setting of coarse atrial fibrillation. Mild  mitral regurgitation. Trivial tricuspid regurgitation.   Assessment and Plan:  1.  Persistent atrial fibrillation, CHA2DS2-VASc score is 2.  She is symptomatic in terms of fatigue and shortness of breath with activity.  Heart rate is in the 80s at rest today on no AV nodal blockers.  As discussed above, she has had prior intolerance to metoprolol and also diltiazem.  She is currently on Xarelto 20 mg daily for stroke prophylaxis.  Plan is to obtain CBC  and BMET.  Also obtain echocardiogram to ensure stability in LVEF and reevaluate left atrial size.  We will get her into the atrial fibrillation clinic for follow-up, specifically to discuss possibility of antiarrhythmic therapy and cardioversion if needed, versus candidacy for ablation ultimately.  2.  Essential hypertension by history, blood pressure is well controlled today.  She is on losartan.  Medication Adjustments/Labs and Tests Ordered: Current medicines are reviewed at length with the patient today.  Concerns regarding medicines are outlined above.   Tests Ordered: Orders Placed This Encounter  Procedures  . Basic metabolic panel  . CBC  . Amb Referral to AFIB Clinic  . EKG 12-Lead  . ECHOCARDIOGRAM COMPLETE    Medication Changes: No orders of the defined types were placed in this encounter.   Disposition:  Follow up 3 months in the [place] office.  Signed, [place], MD, [facility] 05/12/2020 10:59 AM    [facility] at [facility] [address] [place], [place], [place] Phone: [phone]; Fax: [phone]

## 2020-05-15 ENCOUNTER — Telehealth: Payer: Self-pay | Admitting: *Deleted

## 2020-05-15 NOTE — Telephone Encounter (Signed)
-----   Message from Jonelle Sidle, MD sent at 05/13/2020  2:03 PM EST ----- Results reviewed.  Creatinine normal at 0.9 and hemoglobin normal at 15.5.  Continue current dose of Xarelto.

## 2020-05-15 NOTE — Telephone Encounter (Signed)
Patient informed. Copy sent to PCP °

## 2020-06-03 ENCOUNTER — Other Ambulatory Visit: Payer: Medicaid - Out of State

## 2020-06-08 ENCOUNTER — Ambulatory Visit (HOSPITAL_COMMUNITY): Payer: Medicaid - Out of State | Admitting: Physician Assistant

## 2020-07-01 ENCOUNTER — Telehealth: Payer: Self-pay | Admitting: *Deleted

## 2020-07-01 ENCOUNTER — Ambulatory Visit (INDEPENDENT_AMBULATORY_CARE_PROVIDER_SITE_OTHER): Payer: PRIVATE HEALTH INSURANCE

## 2020-07-01 DIAGNOSIS — Z79899 Other long term (current) drug therapy: Secondary | ICD-10-CM

## 2020-07-01 DIAGNOSIS — I480 Paroxysmal atrial fibrillation: Secondary | ICD-10-CM | POA: Diagnosis not present

## 2020-07-01 LAB — ECHOCARDIOGRAM COMPLETE
Area-P 1/2: 5.13 cm2
Calc EF: 44.6 %
MV M vel: 5.32 m/s
MV Peak grad: 113.2 mmHg
Radius: 0.35 cm
S' Lateral: 3.54 cm
Single Plane A2C EF: 47.5 %
Single Plane A4C EF: 41.7 %

## 2020-07-01 NOTE — Telephone Encounter (Signed)
-----   Message from Jonelle Sidle, MD sent at 07/01/2020  9:57 AM EDT ----- Results reviewed.  Please see office note from January.  Echocardiogram shows LVEF approximately 50% in the setting of atrial fibrillation.  Left atrium is moderately to severely dilated suggesting rhythm control may be difficult.  There is mild to moderate mitral regurgitation, unlikely to be symptomatic at this time.  Possibly also small PFO with with no evidence of clinical significance at this point.  Keep pending visit in the atrial fibrillation clinic to discuss possibility of antiarrhythmic therapy in light of symptomatic atrial fibrillation.

## 2020-07-02 ENCOUNTER — Other Ambulatory Visit (HOSPITAL_COMMUNITY)
Admission: RE | Admit: 2020-07-02 | Discharge: 2020-07-02 | Disposition: A | Discharge status: Home / Self Care | Payer: Medicaid - Out of State | Source: Ambulatory Visit | Attending: Cardiology | Admitting: Cardiology

## 2020-07-02 ENCOUNTER — Other Ambulatory Visit: Payer: Self-pay

## 2020-07-02 ENCOUNTER — Ambulatory Visit (HOSPITAL_COMMUNITY)
Admission: RE | Admit: 2020-07-02 | Discharge: 2020-07-02 | Disposition: A | Discharge status: Home / Self Care | Payer: Medicaid - Out of State | Source: Ambulatory Visit | Attending: Physician Assistant | Admitting: Physician Assistant

## 2020-07-02 ENCOUNTER — Encounter (HOSPITAL_COMMUNITY): Payer: Self-pay | Admitting: Physician Assistant

## 2020-07-02 VITALS — BP 112/72 | HR 119 | Ht 68.0 in | Wt 205.2 lb

## 2020-07-02 DIAGNOSIS — R0683 Snoring: Secondary | ICD-10-CM | POA: Diagnosis not present

## 2020-07-02 DIAGNOSIS — Z7951 Long term (current) use of inhaled steroids: Secondary | ICD-10-CM | POA: Diagnosis not present

## 2020-07-02 DIAGNOSIS — Z7901 Long term (current) use of anticoagulants: Secondary | ICD-10-CM | POA: Diagnosis not present

## 2020-07-02 DIAGNOSIS — I10 Essential (primary) hypertension: Secondary | ICD-10-CM | POA: Insufficient documentation

## 2020-07-02 DIAGNOSIS — Z20822 Contact with and (suspected) exposure to covid-19: Secondary | ICD-10-CM | POA: Insufficient documentation

## 2020-07-02 DIAGNOSIS — I340 Nonrheumatic mitral (valve) insufficiency: Secondary | ICD-10-CM | POA: Insufficient documentation

## 2020-07-02 DIAGNOSIS — Z01812 Encounter for preprocedural laboratory examination: Secondary | ICD-10-CM | POA: Insufficient documentation

## 2020-07-02 DIAGNOSIS — Z6831 Body mass index (BMI) 31.0-31.9, adult: Secondary | ICD-10-CM | POA: Insufficient documentation

## 2020-07-02 DIAGNOSIS — Z79899 Other long term (current) drug therapy: Secondary | ICD-10-CM | POA: Insufficient documentation

## 2020-07-02 DIAGNOSIS — R0681 Apnea, not elsewhere classified: Secondary | ICD-10-CM | POA: Insufficient documentation

## 2020-07-02 DIAGNOSIS — R400 Somnolence: Secondary | ICD-10-CM | POA: Insufficient documentation

## 2020-07-02 DIAGNOSIS — E669 Obesity, unspecified: Secondary | ICD-10-CM | POA: Insufficient documentation

## 2020-07-02 DIAGNOSIS — I4811 Longstanding persistent atrial fibrillation: Secondary | ICD-10-CM | POA: Insufficient documentation

## 2020-07-02 LAB — CBC
HCT: 39.3 % (ref 36.0–46.0)
Hemoglobin: 13.7 g/dL (ref 12.0–15.0)
MCH: 30.4 pg (ref 26.0–34.0)
MCHC: 34.9 g/dL (ref 30.0–36.0)
MCV: 87.1 fL (ref 80.0–100.0)
Platelets: 261 10*3/uL (ref 150–400)
RBC: 4.51 MIL/uL (ref 3.87–5.11)
RDW: 13.1 % (ref 11.5–15.5)
WBC: 6.5 10*3/uL (ref 4.0–10.5)
nRBC: 0 % (ref 0.0–0.2)

## 2020-07-02 LAB — BASIC METABOLIC PANEL
Anion gap: 8 (ref 5–15)
BUN: 19 mg/dL (ref 6–20)
CO2: 25 mmol/L (ref 22–32)
Calcium: 9.3 mg/dL (ref 8.9–10.3)
Chloride: 106 mmol/L (ref 98–111)
Creatinine, Ser: 0.85 mg/dL (ref 0.44–1.00)
GFR, Estimated: >60 mL/min (ref >60)
Glucose, Bld: 112 mg/dL — ABNORMAL HIGH (ref 70–99)
Potassium: 4.3 mmol/L (ref 3.5–5.1)
Sodium: 139 mmol/L (ref 135–145)

## 2020-07-02 LAB — SARS CORONAVIRUS 2 (TAT 6-24 HRS): SARS Coronavirus 2: NEGATIVE

## 2020-07-02 LAB — TSH: TSH: 1.673 u[IU]/mL (ref 0.350–4.500)

## 2020-07-02 MED ORDER — FLECAINIDE ACETATE 100 MG PO TABS: 100.0000 mg | ORAL_TABLET | Freq: Two times a day (BID) | ORAL | 3 refills | Status: DC

## 2020-07-02 MED ORDER — LOSARTAN POTASSIUM 50 MG PO TABS: 50.0000 mg | ORAL_TABLET | Freq: Every day | ORAL | 3 refills | Status: DC

## 2020-07-02 MED ORDER — BISOPROLOL FUMARATE 5 MG PO TABS: 2.5000 mg | ORAL_TABLET | Freq: Every day | ORAL | 1 refills | Status: DC

## 2020-07-02 NOTE — H&P (View-Only) (Signed)
Primary Care Physician: Rebecka Apley, NP Primary Cardiologist: Dr Diona Browner Primary Electrophysiologist: none Referring Physician: Dr Ernestina Patches is a 50 y.o. female with a history of HTN and atrial fibrillation who presents for follow up in the [facility]. Last seen by Rudi Coco in 2018. Patient is on Xarelto for a CHADS2VASC score of 2. She was seen by Dr Diona Browner on 05/12/20 and was believed to be in persistent atrial fibrillation for several months at least, if not longer, per the patient's Apple Watch. She does have symptoms of fatigue and dyspnea with activity while in afib. She has been intolerant of metoprolol and diltiazem in the past due to side effects. She denies significant alcohol use but does admit to snoring, daytime somnolence, and witnessed apnea.   Today, she denies symptoms of chest pain, orthopnea, PND, lower extremity edema, dizziness, presyncope, syncope, bleeding, or neurologic sequela. The patient is tolerating medications without difficulties and is otherwise without complaint today.    Atrial Fibrillation Risk Factors:  she does have symptoms or diagnosis of sleep apnea. she does not have a history of rheumatic fever. she does not have a history of alcohol use. The patient does not have a history of early familial atrial fibrillation or other arrhythmias.  she has a BMI of Body mass index is 31.2 kg/m.Marland Kitchen Filed Weights   07/02/20 1012  Weight: 93.1 kg    Family History  Problem Relation Age of Onset  . Diabetes Mother   . Glaucoma Mother   . Heart Problems Brother      Atrial Fibrillation Management history:  Previous antiarrhythmic drugs: none Previous cardioversions: none Previous ablations: none CHADS2VASC score: 2 Anticoagulation history: Xarelto   Past Medical History:  Diagnosis Date  . Asthma   . Depression   . Essential hypertension   . Paroxysmal atrial fibrillation [facility])     Past Surgical History:  Procedure Laterality Date  . TUBAL LIGATION      Current Outpatient Medications  Medication Sig Dispense Refill  . albuterol (VENTOLIN HFA) 108 (90 Base) MCG/ACT inhaler INHALE TWO PUFFS BY MOUTH EVERY 4 HOURS AS NEEDED FOR WHEEZING, SHORTNESS OF BREATH    . amphetamine-dextroamphetamine (ADDERALL) 20 MG tablet Take 20 mg by mouth See admin instructions. Take 20 mg by mouth one to two times a day    . celecoxib (CELEBREX) 200 MG capsule Take by mouth.    . citalopram (CELEXA) 20 MG tablet Take 20 mg by mouth daily.    . Coenzyme Q10 (CO Q-10 PO) Take 1 capsule by mouth daily.    . Flaxseed, Linseed, (FLAX SEED OIL) 1000 MG CAPS Take by mouth.    . fluticasone (FLONASE) 50 MCG/ACT nasal spray Place 2 sprays into both nostrils daily as needed for allergies or rhinitis.     Marland Kitchen losartan (COZAAR) 100 MG tablet Take 100 mg by mouth daily.    . magnesium gluconate (MAGONATE) 500 MG tablet Take 500 mg by mouth daily.    . milk thistle 175 MG tablet Take 175 mg by mouth daily.    . Multiple Vitamins-Calcium (ONE-A-DAY WOMENS PO) Take 1 tablet by mouth daily.     . NON FORMULARY Take 2-5 capsules by mouth See admin instructions. Kratom capsules: Take 5 capsules by mouth in the morning and an additional 2-5 capsules in the evening as needed to promote focusing    . rivaroxaban (XARELTO) 20 MG TABS tablet TAKE 1 TABLET BY MOUTH  DAILY WITH SUPPER 30 tablet 6  . SYMBICORT 80-4.5 MCG/ACT inhaler SMARTSIG:2 Puff(s) By Mouth Twice Daily     No current facility-administered medications for this encounter.    Allergies  Allergen Reactions  . Morphine And Related Itching  . Percocet [Oxycodone-Acetaminophen] Other (See Comments)    Starts to develop headaches if on this for awhile    Social History   Socioeconomic History  . Marital status: Widowed    Spouse name: Not on file  . Number of children: Not on file  . Years of education: Not on file  . Highest education  level: Not on file  Occupational History  . Not on file  Tobacco Use  . Smoking status: Never Smoker  . Smokeless tobacco: Never Used  Substance and Sexual Activity  . Alcohol use: Yes    Alcohol/week: 7.0 standard drinks    Types: 7 Glasses of wine per week  . Drug use: No  . Sexual activity: Not on file  Other Topics Concern  . 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  Physical Exam: Vitals:   07/02/20 1012  BP: 112/72  Pulse: (!) 119  Weight: 93.1 kg  Height: 5\' 8"  (1.727 m)    GEN- The patient is a well appearing obese female, alert and oriented x 3 today.   Head- normocephalic, atraumatic Eyes-  Sclera clear, conjunctiva pink Ears- hearing intact Oropharynx- clear Neck- supple  Lungs- Clear to ausculation bilaterally, normal work of breathing Heart- irregular rate and rhythm, tachycardia, no murmurs, rubs or gallops  GI- soft, NT, ND, + BS Extremities- no clubbing, cyanosis, or edema MS- no significant deformity or atrophy Skin- no rash or lesion Psych- euthymic mood, full affect Neuro- strength and sensation are intact  Wt Readings from Last 3 Encounters:  07/02/20 93.1 kg  05/12/20 91.6 kg  02/27/19 88.5 kg    EKG today demonstrates  Afib RVR Vent. rate 119 BPM PR interval * ms QRS duration 84 ms QT/QTc 318/447 ms  Echo 07/01/20 demonstrated  1. Left ventricular ejection fraction, by estimation, is approximately  50%. The left ventricle has low normal function. The left ventricle  demonstrates global hypokinesis. There is mild left ventricular   hypertrophy. Left ventricular diastolic parameters  are indeterminate.  2. Right ventricular systolic function is normal. The right ventricular  size is normal. There is normal pulmonary artery systolic pressure. The  estimated right ventricular systolic pressure is 22.0 mmHg.  3. Left atrial size was moderately to severely dilated.  4. Right atrial size was upper normal.  5. There is a trivial pericardial effusion posterior to the left  ventricle.  6. The mitral valve is grossly normal. Mild to moderate mitral valve  regurgitation made up of two jets.  7. The aortic valve is tricuspid. Aortic valve regurgitation is not  visualized.  8. The inferior vena cava is normal in size with greater than 50%  respiratory variability, suggesting right atrial pressure of 3 mmHg.  9. Cannot exclude small left to right interatrial shunt, possible PFO.  Comparison(s): Echocardiogram done 09/29/16 showed an EF of 55-60%.   Epic records are reviewed at length today  CHA2DS2-VASc Score = 2  The patient's score is based upon: CHF History: No HTN History: Yes Diabetes History: No Stroke History: No Vascular Disease History: No Age Score: 0 Gender Score: 1      ASSESSMENT AND PLAN: 1. Longstanding Persistent Atrial Fibrillation (ICD10:  I48.11) The patient's CHA2DS2-VASc score is 2, indicating a 2.2% annual risk of stroke.   Patient in rapid symptomatic afib today. We discussed therapeutic options today including AAD therapy (flecainide, Multaq, dofetilide, amiodarone), DCCV, and ablation. We discussed that ablation is less likely to be successful given how long she has been in afib and the size of her LA. However, if she fails AAD, would consider referral to EP given her young age.  Will arrange DCCV and start flecainide 100 mg BID 3 days prior to procedure.  Start bisoprolol 2.5 mg daily today for rate control and to oppose rapid flutter with flecainide. Recall she did not tolerate  metoprolol or diltiazem.  Will plan for stress test after restoring SR.  Check bmet/CBC/TSH Continue Xarelto 20 mg daily.   2. Obesity Body mass index is 31.2 kg/m. Lifestyle modification was discussed at length including regular exercise and weight reduction.  3. Snoring/witnessed apnea/daytime somnolence  The importance of adequate treatment of sleep apnea was discussed today in order to improve our ability to maintain sinus rhythm long term. Will refer to sleep medicine for evaluation and likely sleep study.  4. HTN Stable. Will decrease losartan to 50 mg daily to accommodate rate control.   5. MR Mild to mod on last echo.   Follow up in the AF clinic one week post DCCV.    Ricky Markia Kyer PA-C Afib [facility] [address] [place], [place] [address] [phone] 07/02/2020 10:20 AM  

## 2020-07-02 NOTE — Progress Notes (Addendum)
Primary Care Physician: Rebecka Apley, NP Primary Cardiologist: Dr Diona Browner Primary Electrophysiologist: none Referring Physician: Dr Ernestina Patches is a 50 y.o. female with a history of HTN and atrial fibrillation who presents for follow up in the [facility]. Last seen by Rudi Coco in 2018. Patient is on Xarelto for a CHADS2VASC score of 2. She was seen by Dr Diona Browner on 05/12/20 and was believed to be in persistent atrial fibrillation for several months at least, if not longer, per the patient's Apple Watch. She does have symptoms of fatigue and dyspnea with activity while in afib. She has been intolerant of metoprolol and diltiazem in the past due to side effects. She denies significant alcohol use but does admit to snoring, daytime somnolence, and witnessed apnea.   Today, she denies symptoms of chest pain, orthopnea, PND, lower extremity edema, dizziness, presyncope, syncope, bleeding, or neurologic sequela. The patient is tolerating medications without difficulties and is otherwise without complaint today.    Atrial Fibrillation Risk Factors:  she does have symptoms or diagnosis of sleep apnea. she does not have a history of rheumatic fever. she does not have a history of alcohol use. The patient does not have a history of early familial atrial fibrillation or other arrhythmias.  she has a BMI of Body mass index is 31.2 kg/m.Marland Kitchen Filed Weights   07/02/20 1012  Weight: 93.1 kg    Family History  Problem Relation Age of Onset  . Diabetes Mother   . Glaucoma Mother   . Heart Problems Brother      Atrial Fibrillation Management history:  Previous antiarrhythmic drugs: none Previous cardioversions: none Previous ablations: none CHADS2VASC score: 2 Anticoagulation history: Xarelto   Past Medical History:  Diagnosis Date  . Asthma   . Depression   . Essential hypertension   . Paroxysmal atrial fibrillation [facility])     Past Surgical History:  Procedure Laterality Date  . TUBAL LIGATION      Current Outpatient Medications  Medication Sig Dispense Refill  . albuterol (VENTOLIN HFA) 108 (90 Base) MCG/ACT inhaler INHALE TWO PUFFS BY MOUTH EVERY 4 HOURS AS NEEDED FOR WHEEZING, SHORTNESS OF BREATH    . amphetamine-dextroamphetamine (ADDERALL) 20 MG tablet Take 20 mg by mouth See admin instructions. Take 20 mg by mouth one to two times a day    . celecoxib (CELEBREX) 200 MG capsule Take by mouth.    . citalopram (CELEXA) 20 MG tablet Take 20 mg by mouth daily.    . Coenzyme Q10 (CO Q-10 PO) Take 1 capsule by mouth daily.    . Flaxseed, Linseed, (FLAX SEED OIL) 1000 MG CAPS Take by mouth.    . fluticasone (FLONASE) 50 MCG/ACT nasal spray Place 2 sprays into both nostrils daily as needed for allergies or rhinitis.     Marland Kitchen losartan (COZAAR) 100 MG tablet Take 100 mg by mouth daily.    . magnesium gluconate (MAGONATE) 500 MG tablet Take 500 mg by mouth daily.    . milk thistle 175 MG tablet Take 175 mg by mouth daily.    . Multiple Vitamins-Calcium (ONE-A-DAY WOMENS PO) Take 1 tablet by mouth daily.     . NON FORMULARY Take 2-5 capsules by mouth See admin instructions. Kratom capsules: Take 5 capsules by mouth in the morning and an additional 2-5 capsules in the evening as needed to promote focusing    . rivaroxaban (XARELTO) 20 MG TABS tablet TAKE 1 TABLET BY MOUTH  DAILY WITH SUPPER 30 tablet 6  . SYMBICORT 80-4.5 MCG/ACT inhaler SMARTSIG:2 Puff(s) By Mouth Twice Daily     No current facility-administered medications for this encounter.    Allergies  Allergen Reactions  . Morphine And Related Itching  . Percocet [Oxycodone-Acetaminophen] Other (See Comments)    Starts to develop headaches if on this for awhile    Social History   Socioeconomic History  . Marital status: Widowed    Spouse name: Not on file  . Number of children: Not on file  . Years of education: Not on file  . Highest education  level: Not on file  Occupational History  . Not on file  Tobacco Use  . Smoking status: Never Smoker  . Smokeless tobacco: Never Used  Substance and Sexual Activity  . Alcohol use: Yes    Alcohol/week: 7.0 standard drinks    Types: 7 Glasses of wine per week  . Drug use: No  . Sexual activity: Not on file  Other Topics Concern  . 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  Physical Exam: Vitals:   07/02/20 1012  BP: 112/72  Pulse: (!) 119  Weight: 93.1 kg  Height: 5\' 8"  (1.727 m)    GEN- The patient is a well appearing obese female, alert and oriented x 3 today.   Head- normocephalic, atraumatic Eyes-  Sclera clear, conjunctiva pink Ears- hearing intact Oropharynx- clear Neck- supple  Lungs- Clear to ausculation bilaterally, normal work of breathing Heart- irregular rate and rhythm, tachycardia, no murmurs, rubs or gallops  GI- soft, NT, ND, + BS Extremities- no clubbing, cyanosis, or edema MS- no significant deformity or atrophy Skin- no rash or lesion Psych- euthymic mood, full affect Neuro- strength and sensation are intact  Wt Readings from Last 3 Encounters:  07/02/20 93.1 kg  05/12/20 91.6 kg  02/27/19 88.5 kg    EKG today demonstrates  Afib RVR Vent. rate 119 BPM PR interval * ms QRS duration 84 ms QT/QTc 318/447 ms  Echo 07/01/20 demonstrated  1. Left ventricular ejection fraction, by estimation, is approximately  50%. The left ventricle has low normal function. The left ventricle  demonstrates global hypokinesis. There is mild left ventricular   hypertrophy. Left ventricular diastolic parameters  are indeterminate.  2. Right ventricular systolic function is normal. The right ventricular  size is normal. There is normal pulmonary artery systolic pressure. The  estimated right ventricular systolic pressure is 22.0 mmHg.  3. Left atrial size was moderately to severely dilated.  4. Right atrial size was upper normal.  5. There is a trivial pericardial effusion posterior to the left  ventricle.  6. The mitral valve is grossly normal. Mild to moderate mitral valve  regurgitation made up of two jets.  7. The aortic valve is tricuspid. Aortic valve regurgitation is not  visualized.  8. The inferior vena cava is normal in size with greater than 50%  respiratory variability, suggesting right atrial pressure of 3 mmHg.  9. Cannot exclude small left to right interatrial shunt, possible PFO.  Comparison(s): Echocardiogram done 09/29/16 showed an EF of 55-60%.   Epic records are reviewed at length today  CHA2DS2-VASc Score = 2  The patient's score is based upon: CHF History: No HTN History: Yes Diabetes History: No Stroke History: No Vascular Disease History: No Age Score: 0 Gender Score: 1      ASSESSMENT AND PLAN: 1. Longstanding Persistent Atrial Fibrillation (ICD10:  I48.11) The patient's CHA2DS2-VASc score is 2, indicating a 2.2% annual risk of stroke.   Patient in rapid symptomatic afib today. We discussed therapeutic options today including AAD therapy (flecainide, Multaq, dofetilide, amiodarone), DCCV, and ablation. We discussed that ablation is less likely to be successful given how long she has been in afib and the size of her LA. However, if she fails AAD, would consider referral to EP given her young age.  Will arrange DCCV and start flecainide 100 mg BID 3 days prior to procedure.  Start bisoprolol 2.5 mg daily today for rate control and to oppose rapid flutter with flecainide. Recall she did not tolerate  metoprolol or diltiazem.  Will plan for stress test after restoring SR.  Check bmet/CBC/TSH Continue Xarelto 20 mg daily.   2. Obesity Body mass index is 31.2 kg/m. Lifestyle modification was discussed at length including regular exercise and weight reduction.  3. Snoring/witnessed apnea/daytime somnolence  The importance of adequate treatment of sleep apnea was discussed today in order to improve our ability to maintain sinus rhythm long term. Will refer to sleep medicine for evaluation and likely sleep study.  4. HTN Stable. Will decrease losartan to 50 mg daily to accommodate rate control.   5. MR Mild to mod on last echo.   Follow up in the AF clinic one week post DCCV.    Jorja Loa PA-C [facility] [address] [place], [place] [address] [phone] 07/02/2020 10:20 AM

## 2020-07-02 NOTE — Patient Instructions (Signed)
Cardioversion scheduled for Monday, March 21st  - Arrive at the [facility] and go to admitting at [facility] not eat or drink anything after midnight the night prior to your procedure.  - Take all your morning medication (except diabetic medications) with a sip of water prior to arrival.  - You will not be able to drive home after your procedure.  - Do NOT miss any doses of your blood thinner - if you should miss a dose please notify our office immediately.  - If you feel as if you go back into normal rhythm prior to scheduled cardioversion, please notify our office immediately. If your procedure is canceled in the cardioversion suite you will be charged a cancellation fee.  Decrease losartan to 50mg  once a day Start today -- Bisoprolol 2.5mg  once a day (1/2 of the 5mg  tablet)   On Friday - Start Flecainide 100mg  twice a day  Referral for sleep study consult has been placed. Dr office will contact you to schedule consultation.

## 2020-07-06 ENCOUNTER — Ambulatory Visit (HOSPITAL_COMMUNITY)
Admission: RE | Admit: 2020-07-06 | Discharge: 2020-07-06 | Disposition: A | Discharge status: Home / Self Care | Payer: Medicaid - Out of State | Attending: Cardiology | Admitting: Cardiology

## 2020-07-06 ENCOUNTER — Encounter (HOSPITAL_COMMUNITY)
Admission: RE | Disposition: A | Discharge status: Home / Self Care | Payer: Self-pay | Source: Home / Self Care | Attending: Cardiology

## 2020-07-06 ENCOUNTER — Ambulatory Visit (HOSPITAL_COMMUNITY): Payer: Medicaid - Out of State | Admitting: Certified Registered"

## 2020-07-06 ENCOUNTER — Encounter (HOSPITAL_COMMUNITY): Payer: Self-pay | Admitting: Cardiology

## 2020-07-06 ENCOUNTER — Other Ambulatory Visit: Payer: Self-pay

## 2020-07-06 DIAGNOSIS — Z79899 Other long term (current) drug therapy: Secondary | ICD-10-CM | POA: Insufficient documentation

## 2020-07-06 DIAGNOSIS — R0683 Snoring: Secondary | ICD-10-CM | POA: Insufficient documentation

## 2020-07-06 DIAGNOSIS — Z7901 Long term (current) use of anticoagulants: Secondary | ICD-10-CM | POA: Diagnosis not present

## 2020-07-06 DIAGNOSIS — Z885 Allergy status to narcotic agent status: Secondary | ICD-10-CM | POA: Insufficient documentation

## 2020-07-06 DIAGNOSIS — E669 Obesity, unspecified: Secondary | ICD-10-CM | POA: Insufficient documentation

## 2020-07-06 DIAGNOSIS — I4891 Unspecified atrial fibrillation: Secondary | ICD-10-CM | POA: Diagnosis not present

## 2020-07-06 DIAGNOSIS — Z6831 Body mass index (BMI) 31.0-31.9, adult: Secondary | ICD-10-CM | POA: Insufficient documentation

## 2020-07-06 DIAGNOSIS — I4811 Longstanding persistent atrial fibrillation: Secondary | ICD-10-CM | POA: Diagnosis present

## 2020-07-06 DIAGNOSIS — I10 Essential (primary) hypertension: Secondary | ICD-10-CM | POA: Insufficient documentation

## 2020-07-06 DIAGNOSIS — Z7951 Long term (current) use of inhaled steroids: Secondary | ICD-10-CM | POA: Diagnosis not present

## 2020-07-06 HISTORY — PX: CARDIOVERSION: SHX1299

## 2020-07-06 SURGERY — CARDIOVERSION
Anesthesia: General

## 2020-07-06 MED ORDER — SODIUM CHLORIDE 0.9 % IV SOLN
INTRAVENOUS | Status: AC | PRN
  Administered 2020-07-06: 500 mL via INTRAVENOUS

## 2020-07-06 MED ORDER — LIDOCAINE 2% (20 MG/ML) 5 ML SYRINGE
INTRAMUSCULAR | Status: DC | PRN
  Administered 2020-07-06: 60 mg via INTRAVENOUS

## 2020-07-06 MED ORDER — PROPOFOL 10 MG/ML IV BOLUS
INTRAVENOUS | Status: DC | PRN
  Administered 2020-07-06: 60 mg via INTRAVENOUS

## 2020-07-06 NOTE — Discharge Instructions (Signed)
Electrical Cardioversion Electrical cardioversion is the delivery of a jolt of electricity to restore a normal rhythm to the heart. A rhythm that is too fast or is not regular keeps the heart from pumping well. In this procedure, sticky patches or metal paddles are placed on the chest to deliver electricity to the heart from a device. This procedure may be done in an emergency if:  There is low or no blood pressure as a result of the heart rhythm.  Normal rhythm must be restored as fast as possible to protect the brain and heart from further damage.  It may save a life. This may also be a scheduled procedure for irregular or fast heart rhythms that are not immediately life-threatening. Tell a health care provider about:  Any allergies you have.  All medicines you are taking, including vitamins, herbs, eye drops, creams, and over-the-counter medicines.  Any problems you or family members have had with anesthetic medicines.  Any blood disorders you have.  Any surgeries you have had.  Any medical conditions you have.  Whether you are pregnant or may be pregnant. What are the risks? Generally, this is a safe procedure. However, problems may occur, including:  Allergic reactions to medicines.  A blood clot that breaks free and travels to other parts of your body.  The possible return of an abnormal heart rhythm within hours or days after the procedure.  Your heart stopping (cardiac arrest). This is rare. What happens before the procedure? Medicines  Your health care provider may have you start taking: ? Blood-thinning medicines (anticoagulants) so your blood does not clot as easily. ? Medicines to help stabilize your heart rate and rhythm.  Ask your health care provider about: ? Changing or stopping your regular medicines. This is especially important if you are taking diabetes medicines or blood thinners. ? Taking medicines such as aspirin and ibuprofen. These medicines can  thin your blood. Do not take these medicines unless your health care provider tells you to take them. ? Taking over-the-counter medicines, vitamins, herbs, and supplements. General instructions  Follow instructions from your health care provider about eating or drinking restrictions.  Plan to have someone take you home from the hospital or clinic.  If you will be going home right after the procedure, plan to have someone with you for 24 hours.  Ask your health care provider what steps will be taken to help prevent infection. These may include washing your skin with a germ-killing soap. What happens during the procedure?  An IV will be inserted into one of your veins.  Sticky patches (electrodes) or metal paddles may be placed on your chest.  You will be given a medicine to help you relax (sedative).  An electrical shock will be delivered. The procedure may vary among health care providers and hospitals.   What can I expect after the procedure?  Your blood pressure, heart rate, breathing rate, and blood oxygen level will be monitored until you leave the hospital or clinic.  Your heart rhythm will be watched to make sure it does not change.  You may have some redness on the skin where the shocks were given. Follow these instructions at home:  Do not drive for 24 hours if you were given a sedative during your procedure.  Take over-the-counter and prescription medicines only as told by your health care provider.  Ask your health care provider how to check your pulse. Check it often.  Rest for 48 hours after the procedure   or as told by your health care provider.  Avoid or limit your caffeine use as told by your health care provider.  Keep all follow-up visits as told by your health care provider. This is important. Contact a health care provider if:  You feel like your heart is beating too quickly or your pulse is not regular.  You have a serious muscle cramp that does not go  away. Get help right away if:  You have discomfort in your chest.  You are dizzy or you feel faint.  You have trouble breathing or you are short of breath.  Your speech is slurred.  You have trouble moving an arm or leg on one side of your body.  Your fingers or toes turn cold or blue. Summary  Electrical cardioversion is the delivery of a jolt of electricity to restore a normal rhythm to the heart.  This procedure may be done right away in an emergency or may be a scheduled procedure if the condition is not an emergency.  Generally, this is a safe procedure.  After the procedure, check your pulse often as told by your health care provider. This information is not intended to replace advice given to you by your health care provider. Make sure you discuss any questions you have with your health care provider. Document Revised: 11/05/2018 Document Reviewed: 11/05/2018 Elsevier Patient Education  2021 Elsevier Inc.         Monitored Anesthesia Care, Care After This sheet gives you information about how to care for yourself after your procedure. Your health care provider may also give you more specific instructions. If you have problems or questions, contact your health care provider. What can I expect after the procedure? After the procedure, it is common to have:  Tiredness.  Forgetfulness about what happened after the procedure.  Impaired judgment for important decisions.  Nausea or vomiting.  Some difficulty with balance. Follow these instructions at home: For the time period you were told by your health care provider:  Rest as needed.  Do not participate in activities where you could fall or become injured.  Do not drive or use machinery.  Do not drink alcohol.  Do not take sleeping pills or medicines that cause drowsiness.  Do not make important decisions or sign legal documents.  Do not take care of children on your own.      Eating and  drinking  Follow the diet that is recommended by your health care provider.  Drink enough fluid to keep your urine pale yellow.  If you vomit: ? Drink water, juice, or soup when you can drink without vomiting. ? Make sure you have little or no nausea before eating solid foods. General instructions  Have a responsible adult stay with you for the time you are told. It is important to have someone help care for you until you are awake and alert.  Take over-the-counter and prescription medicines only as told by your health care provider.  If you have sleep apnea, surgery and certain medicines can increase your risk for breathing problems. Follow instructions from your health care provider about wearing your sleep device: ? Anytime you are sleeping, including during daytime naps. ? While taking prescription pain medicines, sleeping medicines, or medicines that make you drowsy.  Avoid smoking.  Keep all follow-up visits as told by your health care provider. This is important. Contact a health care provider if:  You keep feeling nauseous or you keep vomiting.  You feel   light-headed.  You are still sleepy or having trouble with balance after 24 hours.  You develop a rash.  You have a fever.  You have redness or swelling around the IV site. Get help right away if:  You have trouble breathing.  You have new-onset confusion at home. Summary  For several hours after your procedure, you may feel tired. You may also be forgetful and have poor judgment.  Have a responsible adult stay with you for the time you are told. It is important to have someone help care for you until you are awake and alert.  Rest as told. Do not drive or operate machinery. Do not drink alcohol or take sleeping pills.  Get help right away if you have trouble breathing, or if you suddenly become confused. This information is not intended to replace advice given to you by your health care provider. Make sure  you discuss any questions you have with your health care provider. Document Revised: 12/19/2019 Document Reviewed: 03/07/2019 Elsevier Patient Education  2021 Elsevier Inc.     

## 2020-07-06 NOTE — Interval H&P Note (Signed)
History and Physical Interval Note:  07/06/2020 10:36 AM  Dana Ortiz  has presented today for surgery, with the diagnosis of A-FIB.  The various methods of treatment have been discussed with the patient and family. After consideration of risks, benefits and other options for treatment, the patient has consented to  Procedure(s): CARDIOVERSION (N/A) as a surgical intervention.  The patient's history has been reviewed, patient examined, no change in status, stable for surgery.  I have reviewed the patient's chart and labs.  Questions were answered to the patient's satisfaction.     Ameenah Prosser [employer]

## 2020-07-06 NOTE — Procedures (Signed)
Electrical Cardioversion Procedure Note Dana Ortiz [id number] Jul 16, 1970  Procedure: Electrical Cardioversion Indications:  Atrial Fibrillation  Procedure Details Consent: Risks of procedure as well as the alternatives and risks of each were explained to the (patient/caregiver).  Consent for procedure obtained. Time Out: Verified patient identification, verified procedure, site/side was marked, verified correct patient position, special equipment/implants available, medications/allergies/relevent history reviewed, required imaging and test results available.  Performed  Patient placed on cardiac monitor, pulse oximetry, supplemental oxygen as necessary.  Sedation given: Propofol per anesthesiology Pacer pads placed anterior and posterior chest.  Cardioverted 1 time(s).  Cardioverted at 200J.  Evaluation Findings: Post procedure EKG shows: NSR Complications: None Patient did tolerate procedure well.   Dana Ortiz 07/06/2020, 10:37 AM

## 2020-07-06 NOTE — Transfer of Care (Signed)
Immediate Anesthesia Transfer of Care Note  Patient: Dana Ortiz  Procedure(s) Performed: CARDIOVERSION (N/A )  Patient Location: Endoscopy Unit  Anesthesia Type:General  Level of Consciousness: awake and patient cooperative  Airway & Oxygen Therapy: Patient Spontanous Breathing  Post-op Assessment: Report given to RN, Post -op Vital signs reviewed and stable and Patient moving all extremities  Post vital signs: Reviewed and stable  Last Vitals:  Vitals Value Taken Time  BP    Temp    Pulse    Resp    SpO2      Last Pain:  Vitals:   07/06/20 1013  TempSrc: Oral  PainSc: 0-No pain         Complications: No complications documented.

## 2020-07-06 NOTE — Anesthesia Preprocedure Evaluation (Addendum)
Anesthesia Evaluation  Patient identified by MRN, date of birth, ID band Patient awake    Reviewed: Allergy & Precautions, NPO status , Patient's Chart, lab work & pertinent test results, reviewed documented beta blocker date and time   Airway Mallampati: I  TM Distance: >3 FB Neck ROM: Full    Dental  (+) Teeth Intact, Dental Advisory Given, Caps,    Pulmonary    breath sounds clear to auscultation       Cardiovascular hypertension, Pt. on medications and Pt. on home beta blockers + dysrhythmias Atrial Fibrillation  Rhythm:Irregular Rate:Abnormal     Neuro/Psych    GI/Hepatic   Endo/Other    Renal/GU      Musculoskeletal   Abdominal   Peds  Hematology  (+) Blood dyscrasia (Xarelto), ,   Anesthesia Other Findings   Reproductive/Obstetrics                           Anesthesia Physical Anesthesia Plan  ASA: III  Anesthesia Plan: General   Post-op Pain Management:    Induction:   PONV Risk Score and Plan: 3 and Treatment may vary due to age or medical condition and Propofol infusion  Airway Management Planned: Natural Airway and Mask  Additional Equipment: None  Intra-op Plan:   Post-operative Plan:   Informed Consent: I have reviewed the patients History and Physical, chart, labs and discussed the procedure including the risks, benefits and alternatives for the proposed anesthesia with the patient or authorized representative who has indicated his/her understanding and acceptance.     Dental advisory given  Plan Discussed with: CRNA  Anesthesia Plan Comments:        Anesthesia Quick Evaluation

## 2020-07-07 NOTE — Telephone Encounter (Signed)
Discussed during office visit on 07/02/2020 Copy sent to PCP

## 2020-07-08 ENCOUNTER — Encounter (HOSPITAL_COMMUNITY): Payer: Self-pay

## 2020-07-08 ENCOUNTER — Encounter (HOSPITAL_COMMUNITY): Payer: Self-pay | Admitting: Cardiology

## 2020-07-08 NOTE — Anesthesia Postprocedure Evaluation (Signed)
Anesthesia Post Note  Patient: Dana Ortiz  Procedure(s) Performed: CARDIOVERSION (N/A )     Patient location during evaluation: Endoscopy Anesthesia Type: General Level of consciousness: awake and alert Pain management: pain level controlled Vital Signs Assessment: post-procedure vital signs reviewed and stable Respiratory status: spontaneous breathing, nonlabored ventilation, respiratory function stable and patient connected to nasal cannula oxygen Cardiovascular status: stable Postop Assessment: no apparent nausea or vomiting Anesthetic complications: no   No complications documented.  Last Vitals:  Vitals:   07/06/20 1100 07/06/20 1110  BP: (!) 113/59 108/61  Pulse: (!) 56 (!) 51  Resp: 13 10  Temp:    SpO2: 92% 97%    Last Pain:  Vitals:   07/06/20 1110  TempSrc:   PainSc: 0-No pain                 Knut Rondinelli

## 2020-07-10 NOTE — Telephone Encounter (Signed)
Would probably start by following blood pressure for the next few days to make sure that it is not too low or that any medication changes need to be made.  This could be completely unrelated to blood pressure however, possibly sinuses. If symptoms persist, would arrange a nurse visit to check orthostatic vital signs.

## 2020-07-11 IMAGING — CT CT ABDOMEN WITHOUT CONTRAST
2 of 4 series · 14 of 46 positions shown, 16 images · non-contrast
Comparison: Limited right upper quadrant abdominal ultrasound
10/10/2018.

CLINICAL DATA: Right upper quadrant abdominal pain and vomiting for
1 month. History of hypertension and tubal ligation.

EXAM:
CT ABDOMEN WITHOUT CONTRAST
TECHNIQUE: Multidetector CT imaging of the abdomen was performed following the
standard protocol without IV contrast.

[Series 2: axial st · axial · 0.86mm/px · z∈[-318,-118]mm · 11 of 49 slices shown, 13 images]
[im 5/49  soft-tissue]
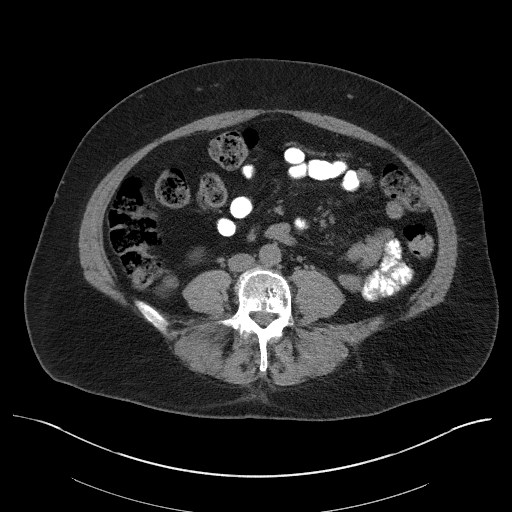
[im 5/49  bone]
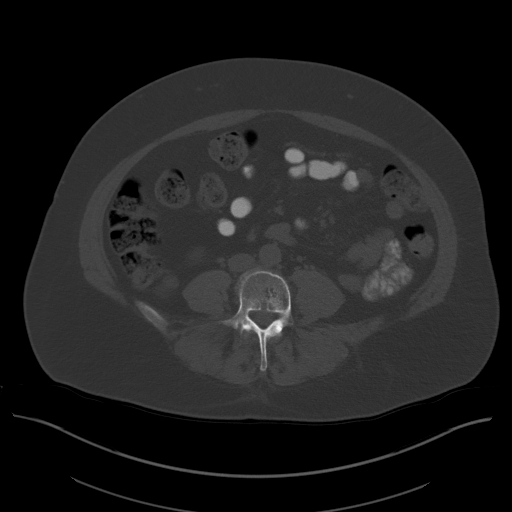
[im 9/49  soft-tissue]
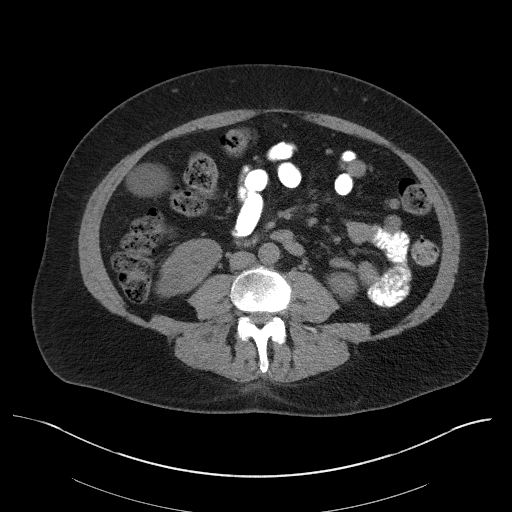
[im 13/49  soft-tissue]
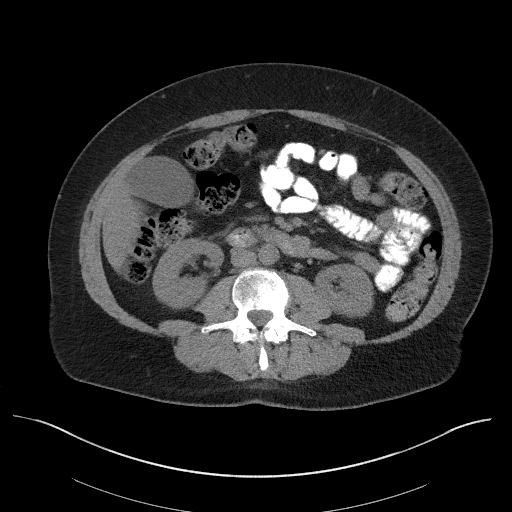
[im 17/49  soft-tissue]
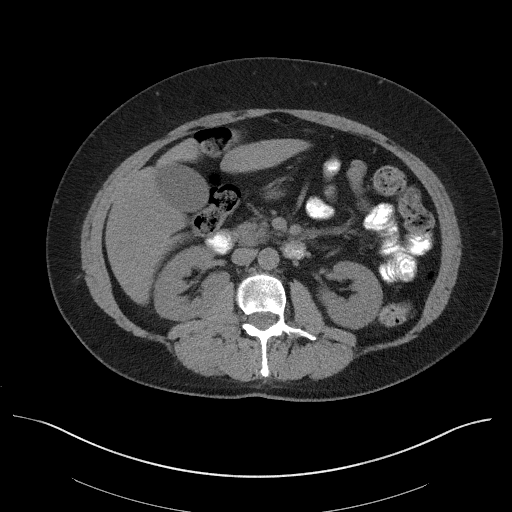
[im 21/49  soft-tissue]
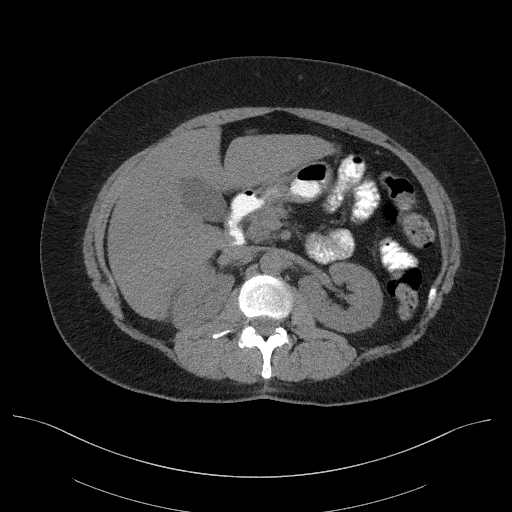
[im 25/49  soft-tissue]
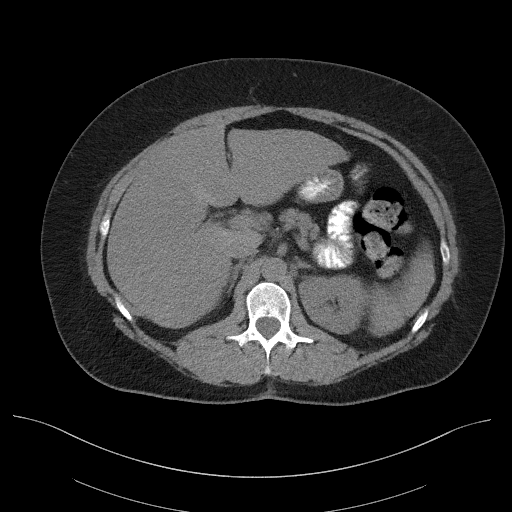
[im 29/49  soft-tissue]
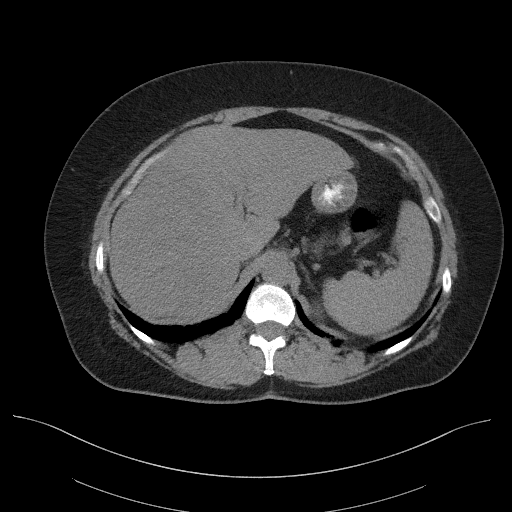
[im 33/49  soft-tissue]
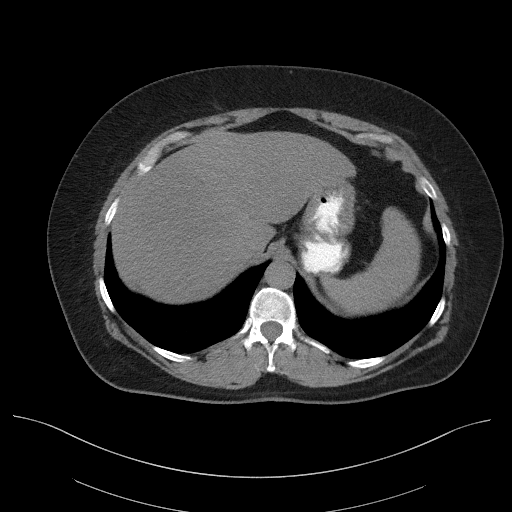
[im 37/49  soft-tissue]
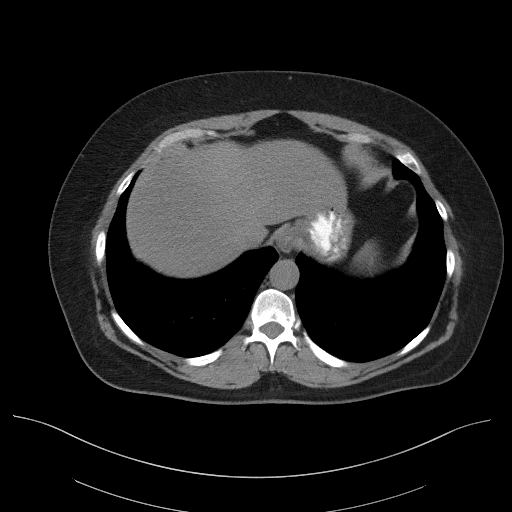
[im 37/49  bone]
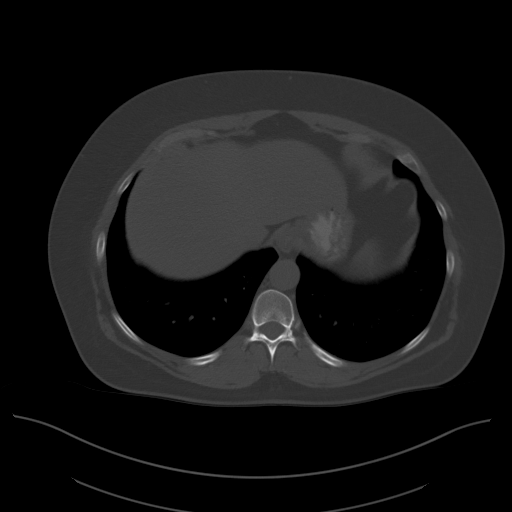
[im 41/49  soft-tissue]
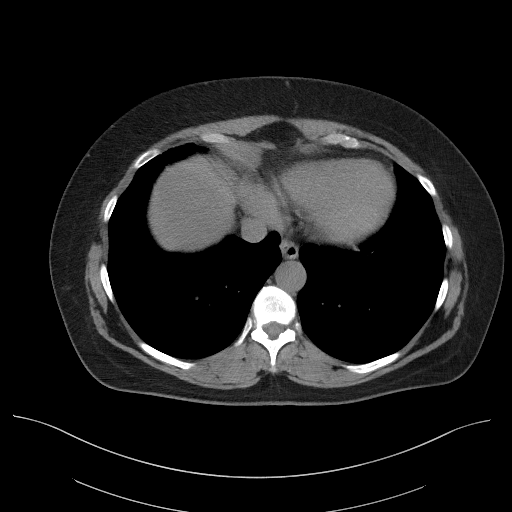
[im 45/49  soft-tissue]
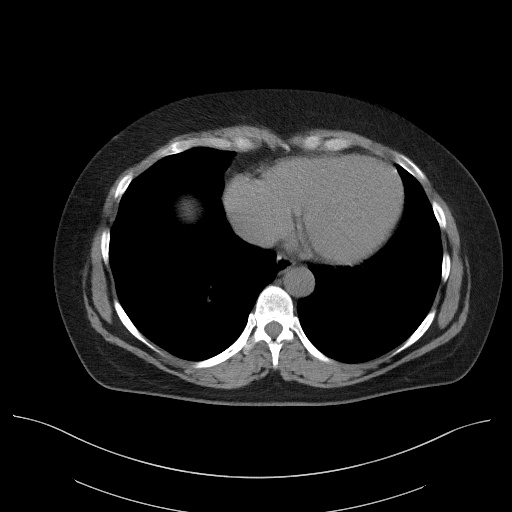

[Series 5: coronal st · coronal · 0.52mm/px · 3 of 109 slices shown]
[im 37/109  soft-tissue]
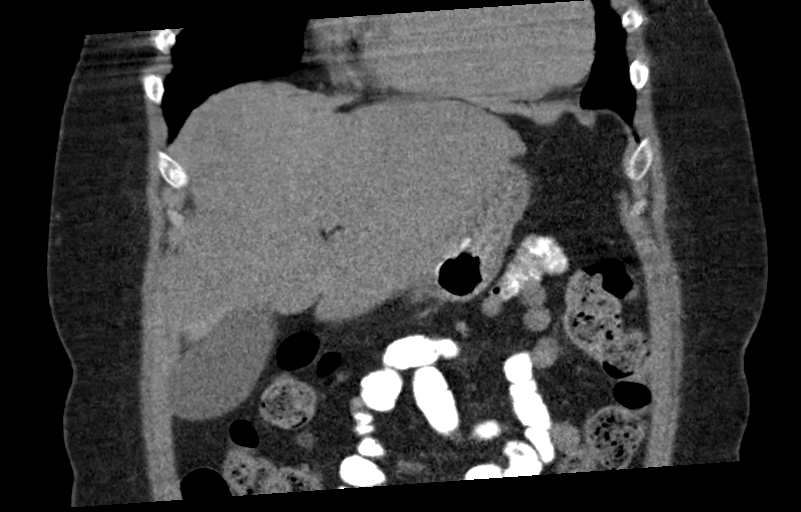
[im 49/109  soft-tissue]
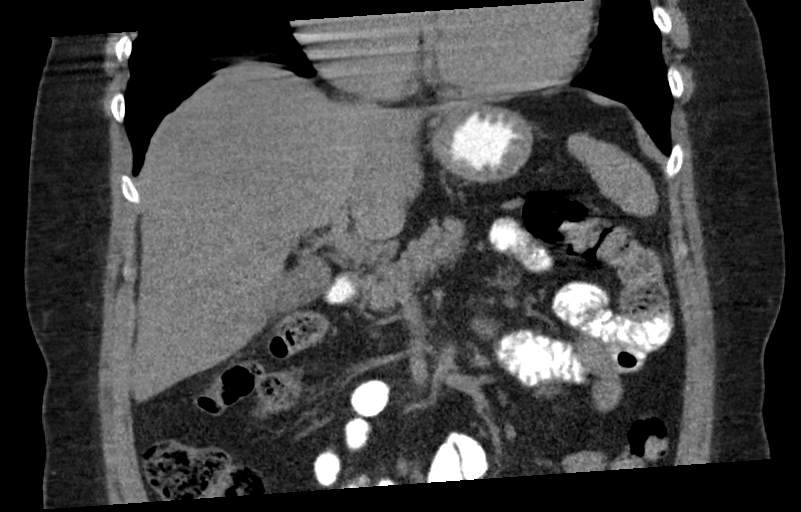
[im 61/109  soft-tissue]
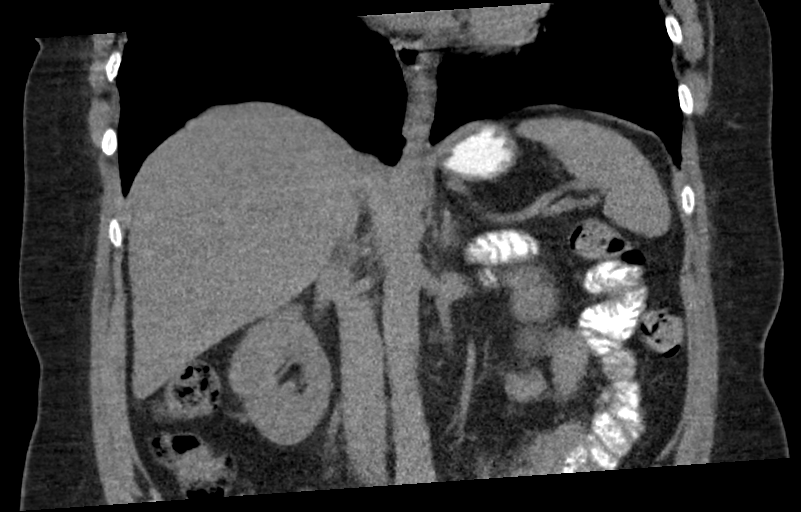

[14 of 46 positions shown; findings below may reference images not displayed]

FINDINGS: Lower chest: Evaluation of the lung bases is mildly limited by
breathing artifact. Minimal dependent atelectasis at both lung
bases. No significant pleural or pericardial effusion.

Hepatobiliary: There is diffusely decreased density throughout the
liver consistent with steatosis. No focal abnormality identified on
noncontrast imaging. No evidence of gallstones, gallbladder wall
thickening or biliary dilatation.

Pancreas: Unremarkable. No pancreatic ductal dilatation or
surrounding inflammatory changes.

Spleen: Normal in size without focal abnormality.

Adrenals/Urinary Tract: Both adrenal glands appear normal. 2.6 cm
exophytic lesion from the lower pole of the right kidney measures
water density, consistent with a cyst. The kidneys otherwise appear
normal. No evidence of urinary tract calculus or hydronephrosis.

Stomach/Bowel: There is a moderate amount of stool throughout the
colon. The stomach, and visualized portions of the small bowel and
colon otherwise appear normal. The appendix is not imaged.

Vascular/Lymphatic: There are no enlarged abdominal lymph nodes. No
significant vascular findings on noncontrast imaging.

Other: The visualized anterior abdominal wall is intact. No ascites.

Musculoskeletal: No acute or significant osseous findings. Age
advanced spondylosis at the L2-3 and L4-5 levels.
IMPRESSION: 1. No acute abdominal findings or explanation for the patient's
symptoms.
2. Hepatic steatosis.
3. Moderate stool throughout the colon.
4. Exophytic right renal cyst.

## 2020-07-13 ENCOUNTER — Other Ambulatory Visit: Payer: Self-pay

## 2020-07-13 ENCOUNTER — Encounter (HOSPITAL_COMMUNITY): Payer: Self-pay | Admitting: Physician Assistant

## 2020-07-13 ENCOUNTER — Ambulatory Visit (HOSPITAL_COMMUNITY)
Admission: RE | Admit: 2020-07-13 | Discharge: 2020-07-13 | Disposition: A | Discharge status: Home / Self Care | Payer: Medicaid - Out of State | Source: Ambulatory Visit | Attending: Physician Assistant | Admitting: Physician Assistant

## 2020-07-13 VITALS — BP 110/80 | HR 97 | Ht 68.0 in | Wt 203.2 lb

## 2020-07-13 DIAGNOSIS — Z95810 Presence of automatic (implantable) cardiac defibrillator: Secondary | ICD-10-CM | POA: Insufficient documentation

## 2020-07-13 DIAGNOSIS — I4811 Longstanding persistent atrial fibrillation: Secondary | ICD-10-CM | POA: Diagnosis present

## 2020-07-13 DIAGNOSIS — E669 Obesity, unspecified: Secondary | ICD-10-CM | POA: Insufficient documentation

## 2020-07-13 DIAGNOSIS — Z885 Allergy status to narcotic agent status: Secondary | ICD-10-CM | POA: Insufficient documentation

## 2020-07-13 DIAGNOSIS — Z6830 Body mass index (BMI) 30.0-30.9, adult: Secondary | ICD-10-CM | POA: Insufficient documentation

## 2020-07-13 DIAGNOSIS — R400 Somnolence: Secondary | ICD-10-CM | POA: Insufficient documentation

## 2020-07-13 DIAGNOSIS — R42 Dizziness and giddiness: Secondary | ICD-10-CM | POA: Diagnosis not present

## 2020-07-13 DIAGNOSIS — R0683 Snoring: Secondary | ICD-10-CM | POA: Insufficient documentation

## 2020-07-13 DIAGNOSIS — Z7901 Long term (current) use of anticoagulants: Secondary | ICD-10-CM | POA: Diagnosis not present

## 2020-07-13 DIAGNOSIS — Z79899 Other long term (current) drug therapy: Secondary | ICD-10-CM | POA: Insufficient documentation

## 2020-07-13 DIAGNOSIS — Z713 Dietary counseling and surveillance: Secondary | ICD-10-CM | POA: Insufficient documentation

## 2020-07-13 DIAGNOSIS — I10 Essential (primary) hypertension: Secondary | ICD-10-CM | POA: Insufficient documentation

## 2020-07-13 DIAGNOSIS — Z886 Allergy status to analgesic agent status: Secondary | ICD-10-CM | POA: Diagnosis not present

## 2020-07-13 NOTE — Progress Notes (Signed)
Primary Care Physician: Rebecka Apley, NP Primary Cardiologist: Dr Diona Browner Primary Electrophysiologist: none Referring Physician: Dr Ernestina Patches is a 50 y.o. female with a history of HTN and atrial fibrillation who presents for follow up in the [facility]. Last seen by Rudi Coco in 2018. Patient is on Xarelto for a CHADS2VASC score of 2. She was seen by Dr Diona Browner on 05/12/20 and was believed to be in persistent atrial fibrillation for several months at least, if not longer, per the patient's Apple Watch. She does have symptoms of fatigue and dyspnea with activity while in afib. She has been intolerant of metoprolol and diltiazem in the past due to side effects. She denies significant alcohol use but does admit to snoring, daytime somnolence, and witnessed apnea.   On follow up today, patient is s/p DCCV on 07/06/20. Unfortunately, she has had quick return of her afib just under two days later. She does report she felt "great" for those two days which much more energy. She has noticed intermittent lightheadedness since being back in afib. She denies any bleeding issues on anticoagulation.   Today, she denies symptoms of palpitations, chest pain, orthopnea, PND, lower extremity edema, presyncope, syncope, bleeding, or neurologic sequela. The patient is tolerating medications without difficulties and is otherwise without complaint today.    Atrial Fibrillation Risk Factors:  she does have symptoms or diagnosis of sleep apnea. she does not have a history of rheumatic fever. she does not have a history of alcohol use. The patient does not have a history of early familial atrial fibrillation or other arrhythmias.  she has a BMI of Body mass index is 30.9 kg/m.Marland Kitchen Filed Weights   07/13/20 1451  Weight: 92.2 kg    Family History  Problem Relation Age of Onset  . Diabetes Mother   . Glaucoma Mother   . Heart Problems Brother       Atrial Fibrillation Management history:  Previous antiarrhythmic drugs: flecainide  Previous cardioversions: 07/06/20 Previous ablations: none CHADS2VASC score: 2 Anticoagulation history: Xarelto   Past Medical History:  Diagnosis Date  . Asthma   . Depression   . Essential hypertension   . Paroxysmal atrial fibrillation [facility])    Past Surgical History:  Procedure Laterality Date  . CARDIOVERSION N/A 07/06/2020   Procedure: CARDIOVERSION;  Surgeon: Laurey Morale, MD;  Location: [facility] ENDOSCOPY;  Service: Cardiovascular;  Laterality: N/A;  . TUBAL LIGATION      Current Outpatient Medications  Medication Sig Dispense Refill  . albuterol (VENTOLIN HFA) 108 (90 Base) MCG/ACT inhaler Inhale 2 puffs into the lungs every 6 (six) hours as needed for wheezing or shortness of breath.    . amphetamine-dextroamphetamine (ADDERALL) 20 MG tablet Take 20 mg by mouth 3 (three) times daily.    . bisoprolol (ZEBETA) 5 MG tablet Take 0.5 tablets (2.5 mg total) by mouth daily. 30 tablet 1  . celecoxib (CELEBREX) 200 MG capsule Take 200 mg by mouth at bedtime as needed (Arthritis).    . citalopram (CELEXA) 20 MG tablet Take 20 mg by mouth at bedtime.    . flecainide (TAMBOCOR) 100 MG tablet Take 1 tablet (100 mg total) by mouth 2 (two) times daily. 60 tablet 3  . fluticasone (FLONASE) 50 MCG/ACT nasal spray Place 2 sprays into both nostrils daily as needed for allergies or rhinitis.     Marland Kitchen losartan (COZAAR) 50 MG tablet Take 1 tablet (50 mg total) by mouth daily. (Patient  taking differently: Take 50 mg by mouth every evening.) 30 tablet 3  . Multiple Vitamins-Calcium (ONE-A-DAY WOMENS PO) Take 1 tablet by mouth daily.     . rivaroxaban (XARELTO) 20 MG TABS tablet TAKE 1 TABLET BY MOUTH DAILY WITH SUPPER (Patient taking differently: Take 20 mg by mouth daily with supper.) 30 tablet 6  . SYMBICORT 80-4.5 MCG/ACT inhaler Inhale 2 puffs into the lungs daily as needed (seasonal allergies).     No  current facility-administered medications for this encounter.    Allergies  Allergen Reactions  . Morphine And Related Itching  . Percocet [Oxycodone-Acetaminophen] Other (See Comments)    Starts to develop headaches if on this for awhile    Social History   Socioeconomic History  . Marital status: Widowed    Spouse name: Not on file  . Number of children: Not on file  . Years of education: Not on file  . Highest education level: Not on file  Occupational History  . Not on file  Tobacco Use  . Smoking status: Never Smoker  . Smokeless tobacco: Never Used  Substance and Sexual Activity  . Alcohol use: Yes    Alcohol/week: 7.0 standard drinks    Types: 7 Glasses of wine per week  . Drug use: No  . Sexual activity: Not on file  Other Topics Concern  . 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  Physical Exam: Vitals:   07/13/20 1451  BP: 110/80  Pulse: 97  Weight: 92.2 kg  Height: 5\' 8"  (1.727 m)    GEN- The patient is a well appearing obese female, alert and oriented x 3 today.   HEENT-head normocephalic, atraumatic, sclera clear, conjunctiva pink, hearing intact, trachea midline. Lungs- Clear to ausculation bilaterally, normal work of breathing Heart- irregular rate and rhythm, no murmurs, rubs or gallops  GI- soft, NT, ND, + BS Extremities- no clubbing, cyanosis, or edema MS- no significant deformity or atrophy Skin- no rash or lesion Psych- euthymic mood, full affect Neuro- strength and sensation are intact   Wt Readings from Last 3 Encounters:  07/13/20  92.2 kg  07/06/20 93 kg  07/02/20 93.1 kg    EKG today demonstrates  Coarse afib vs atypical atrial flutter with variable block Vent. rate 97 BPM PR interval * ms QRS duration 96 ms QT/QTc 358/454 ms  Echo 07/01/20 demonstrated  1. Left ventricular ejection fraction, by estimation, is approximately  50%. The left ventricle has low normal function. The left ventricle  demonstrates global hypokinesis. There is mild left ventricular  hypertrophy. Left ventricular diastolic parameters  are indeterminate.  2. Right ventricular systolic function is normal. The right ventricular  size is normal. There is normal pulmonary artery systolic pressure. The  estimated right ventricular systolic pressure is 22.0 mmHg.  3. Left atrial size was moderately to severely dilated.  4. Right atrial size was upper normal.  5. There is a trivial pericardial effusion posterior to the left  ventricle.  6. The mitral valve is grossly normal. Mild to moderate mitral valve  regurgitation made up of two jets.  7. The aortic valve is tricuspid. Aortic valve regurgitation is not  visualized.  8. The inferior vena cava is normal in size with greater than 50%  respiratory variability, suggesting right atrial pressure of 3 mmHg.  9. Cannot exclude small left to right interatrial shunt, possible PFO.   Comparison(s): Echocardiogram done 09/29/16 showed an EF of 55-60%.   Epic records are reviewed at length today  CHA2DS2-VASc Score = 2  The patient's score is based upon: CHF History: No HTN History: Yes Diabetes History: No Stroke History: No Vascular Disease History: No Age Score: 0 Gender Score: 1      ASSESSMENT AND PLAN: 1. Longstanding Persistent Atrial Fibrillation (ICD10:  I48.11) The patient's CHA2DS2-VASc score is 2, indicating a 2.2% annual risk of stroke.   S/p DCCV on 07/06/20 Unfortunately, she has had quick return of her afib. We discussed therapeutic options today including  alternate AAD (dofetilide) vs ablation. Given her young age, will refer her to EP for ablation consideration. Her LAE is mod-severe. (3.8 cm diam, Vol 93.3 mL). If she is not felt to be a good ablation candidate, she is agreeable to dofetilide admission. Information about admission given today. Would likely need to change citalopram to duloxetine. Continue bisoprolol 2.5 mg daily Stop flecainide today.  Continue Xarelto 20 mg daily.   2. Obesity Body mass index is 30.9 kg/m. Lifestyle modification was discussed and encouraged including regular physical activity and weight reduction.  3. Snoring/witnessed apnea/daytime somnolence  Referral sent to Dr Earl Gala at [facility] for evaluation.   4. HTN Stable, no changes today.  5. MR Mild to moderate.   Follow up with EP to discuss ablation vs dofetilide admission.    Jorja Loa PA-C [facility] [address] [place], [place] [address] [phone] 07/13/2020 3:00 PM

## 2020-07-22 ENCOUNTER — Encounter: Payer: Self-pay | Admitting: Physician Assistant

## 2020-08-17 NOTE — Progress Notes (Signed)
Cardiology Office Note  Date: 08/18/2020   ID: Dana Ortiz, DOB May 12, 1970, MRN [id number]  PCP:  Rebecka Apley, NP  Cardiologist:  Nona Dell, MD Electrophysiologist:  None   Chief Complaint  Patient presents with  . Cardiac follow-up    History of Present Illness: Dana Ortiz is a 50 y.o. female last seen in January.  Interval follow-up noted in the atrial fibrillation clinic, I reviewed the note.  She had recurrent atrial fibrillation despite cardioversion attempt on flecainide and has been referred to EP for discussion of possible ablation versus initiation of Tikosyn.  She sees Dr. Johney Frame tomorrow.  In talking with her today, it sounds like she will probably not pursue Tikosyn since she would have to stop citalopram and feels like she has done very well on that medication.  Follow-up echocardiogram in March revealed LVEF approximately 50%, normal RV contraction with RVSP estimated 22 mmHg, moderately to severely dilated left atrium, mild to moderate mitral regurgitation, and possibly small PFO.  I personally reviewed her ECG today which shows rate controlled atrial fibrillation with low voltage.  Medications are outlined below.  Past Medical History:  Diagnosis Date  . Asthma   . Depression   . Essential hypertension   . Paroxysmal atrial fibrillation [facility])     Past Surgical History:  Procedure Laterality Date  . CARDIOVERSION N/A 07/06/2020   Procedure: CARDIOVERSION;  Surgeon: Laurey Morale, MD;  Location: [facility] ENDOSCOPY;  Service: Cardiovascular;  Laterality: N/A;  . TUBAL LIGATION      Current Outpatient Medications  Medication Sig Dispense Refill  . albuterol (VENTOLIN HFA) 108 (90 Base) MCG/ACT inhaler Inhale 2 puffs into the lungs every 6 (six) hours as needed for wheezing or shortness of breath.    . amphetamine-dextroamphetamine (ADDERALL) 20 MG tablet Take 20 mg by mouth 3 (three) times daily.    . bisoprolol (ZEBETA) 5 MG tablet Take 0.5  tablets (2.5 mg total) by mouth daily. 30 tablet 1  . celecoxib (CELEBREX) 200 MG capsule Take 200 mg by mouth at bedtime as needed (Arthritis).    . citalopram (CELEXA) 20 MG tablet Take 20 mg by mouth at bedtime.    . fluticasone (FLONASE) 50 MCG/ACT nasal spray Place 2 sprays into both nostrils daily as needed for allergies or rhinitis.     Marland Kitchen losartan (COZAAR) 50 MG tablet Take 1 tablet (50 mg total) by mouth daily. (Patient taking differently: Take 50 mg by mouth every evening.) 30 tablet 3  . Multiple Vitamins-Calcium (ONE-A-DAY WOMENS PO) Take 1 tablet by mouth daily.     . rivaroxaban (XARELTO) 20 MG TABS tablet TAKE 1 TABLET BY MOUTH DAILY WITH SUPPER (Patient taking differently: Take 20 mg by mouth daily with supper.) 30 tablet 6  . SYMBICORT 80-4.5 MCG/ACT inhaler Inhale 2 puffs into the lungs daily as needed (seasonal allergies).     No current facility-administered medications for this visit.   Allergies:  Morphine and related and Percocet [oxycodone-acetaminophen]   ROS: No dizziness or syncope.  Intermittent, sharp shooting left-sided chest discomfort, nonexertional.  Physical Exam: VS:  BP 112/82   Pulse 95   Ht 5\' 8"  (1.727 m)   Wt 205 lb (93 kg)   SpO2 98%   BMI 31.17 kg/m , BMI Body mass index is 31.17 kg/m.  Wt Readings from Last 3 Encounters:  08/18/20 205 lb (93 kg)  07/13/20 203 lb 3.2 oz (92.2 kg)  07/06/20 205 lb (93 kg)  General: Patient appears comfortable at rest. HEENT: Conjunctiva and lids normal, wearing a mask. Neck: Supple, no elevated JVP or carotid bruits, no thyromegaly. Lungs: Clear to auscultation, nonlabored breathing at rest. Cardiac: Regularly irregular, no S3 or significant systolic murmur, no pericardial rub.  ECG:  An ECG dated 07/13/2020 was personally reviewed today and demonstrated:  Atrial flutter with variable conduction.  Recent Labwork: 07/02/2020: BUN 19; Creatinine, Ser 0.85; Hemoglobin 13.7; Platelets 261; Potassium 4.3;  Sodium 139; TSH 1.673   Other Studies Reviewed Today:  Echocardiogram 07/01/2020: 1. Left ventricular ejection fraction, by estimation, is approximately  50%. The left ventricle has low normal function. The left ventricle  demonstrates global hypokinesis. There is mild left ventricular  hypertrophy. Left ventricular diastolic parameters  are indeterminate.  2. Right ventricular systolic function is normal. The right ventricular  size is normal. There is normal pulmonary artery systolic pressure. The  estimated right ventricular systolic pressure is 22.0 mmHg.  3. Left atrial size was moderately to severely dilated.  4. Right atrial size was upper normal.  5. There is a trivial pericardial effusion posterior to the left  ventricle.  6. The mitral valve is grossly normal. Mild to moderate mitral valve  regurgitation made up of two jets.  7. The aortic valve is tricuspid. Aortic valve regurgitation is not  visualized.  8. The inferior vena cava is normal in size with greater than 50%  respiratory variability, suggesting right atrial pressure of 3 mmHg.  9. Cannot exclude small left to right interatrial shunt, possible PFO.   Assessment and Plan:  1.  Persistent atrial fibrillation with CHA2DS2-VASc score of 2.  She remains on Xarelto for stroke prophylaxis.  Also on Zebeta for heart rate control.  Remains symptomatic in terms of fatigue and shortness of breath.  She did have evaluation through the atrial fibrillation clinic and underwent cardioversion attempt on flecainide, was successful for only about 2 days.  During that time she did feel much better.  Consultation pending with Dr. Johney Frame tomorrow for discussion of potential ablation.  Recent echocardiogram does show moderately to severely dilated left atrium.  Tikosyn probably not the best option for her since stopping citalopram is not something that she wants to consider.  2.  Essential hypertension, blood pressure is well  controlled today.  No changes made.  Medication Adjustments/Labs and Tests Ordered: Current medicines are reviewed at length with the patient today.  Concerns regarding medicines are outlined above.   Tests Ordered: Orders Placed This Encounter  Procedures  . EKG 12-Lead    Medication Changes: No orders of the defined types were placed in this encounter.   Disposition:  Follow up 3 months.  Signed, Jonelle Sidle, MD, [facility] 08/18/2020 9:16 AM    [facility] at [facility] [address] [place], [place], [place] [address] Phone: [phone]; Fax: [phone]

## 2020-08-18 ENCOUNTER — Ambulatory Visit (INDEPENDENT_AMBULATORY_CARE_PROVIDER_SITE_OTHER): Payer: PRIVATE HEALTH INSURANCE | Admitting: Cardiology

## 2020-08-18 ENCOUNTER — Encounter: Payer: Self-pay | Admitting: Cardiology

## 2020-08-18 VITALS — BP 112/82 | HR 95 | Ht 68.0 in | Wt 205.0 lb

## 2020-08-18 DIAGNOSIS — I4819 Other persistent atrial fibrillation: Secondary | ICD-10-CM

## 2020-08-18 DIAGNOSIS — I10 Essential (primary) hypertension: Secondary | ICD-10-CM

## 2020-08-18 NOTE — Patient Instructions (Addendum)
Medication Instructions:   Your physician recommends that you continue on your current medications as directed. Please refer to the Current Medication list given to you today.  Labwork:  none  Testing/Procedures:  none  Follow-Up:  Your physician recommends that you schedule a follow-up appointment in: 3 months.  Any Other Special Instructions Will Be Listed Below (If Applicable).  If you need a refill on your cardiac medications before your next appointment, please call your pharmacy. 

## 2020-08-19 ENCOUNTER — Encounter: Payer: Self-pay | Admitting: *Deleted

## 2020-08-19 ENCOUNTER — Encounter: Payer: Self-pay | Admitting: Internal Medicine

## 2020-08-19 ENCOUNTER — Ambulatory Visit (INDEPENDENT_AMBULATORY_CARE_PROVIDER_SITE_OTHER): Payer: PRIVATE HEALTH INSURANCE | Admitting: Internal Medicine

## 2020-08-19 ENCOUNTER — Other Ambulatory Visit: Payer: Self-pay

## 2020-08-19 VITALS — BP 110/72 | HR 104 | Ht 68.0 in | Wt 206.8 lb

## 2020-08-19 DIAGNOSIS — D6869 Other thrombophilia: Secondary | ICD-10-CM

## 2020-08-19 DIAGNOSIS — I4891 Unspecified atrial fibrillation: Secondary | ICD-10-CM | POA: Diagnosis not present

## 2020-08-19 DIAGNOSIS — I10 Essential (primary) hypertension: Secondary | ICD-10-CM

## 2020-08-19 DIAGNOSIS — I4819 Other persistent atrial fibrillation: Secondary | ICD-10-CM | POA: Diagnosis not present

## 2020-08-19 MED ORDER — METOPROLOL TARTRATE 50 MG PO TABS: 50.0000 mg | ORAL_TABLET | Freq: Once | ORAL | 0 refills | Status: DC | PRN

## 2020-08-19 NOTE — Progress Notes (Signed)
Electrophysiology Office Note   Date:  08/19/2020   ID:  Dana Ortiz, DOB 1970/11/21, MRN [id number]  PCP:  Rebecka Apley, NP  Cardiologist:  Dr Diona Browner Primary Electrophysiologist: Hillis Range, MD    CC: afib   History of Present Illness: Dana Ortiz is a 50 y.o. female who presents today for electrophysiology evaluation.   She is referred by Dr Diona Browner and Jorja Loa. The patient reports being diagnosed with atrial fibrillation several years ago.  She has been followed in the AF clinic since at least 2018.  She reports symptoms of fatigue and decreased exercise tolerance with afib.  She underwent cardioversion in March but quickly returned to afib.  Her symptoms were better in sinus. She has failed medical therapy with flecainide.  She has moderaet to severe LA enlargement.  Today, she denies symptoms of palpitations, chest pain, shortness of breath, orthopnea, PND, lower extremity edema, claudication, dizziness, presyncope, syncope, bleeding, or neurologic sequela. The patient is tolerating medications without difficulties and is otherwise without complaint today.    Past Medical History:  Diagnosis Date  . Asthma   . Depression   . Essential hypertension   . Paroxysmal atrial fibrillation [facility])    Past Surgical History:  Procedure Laterality Date  . CARDIOVERSION N/A 07/06/2020   Procedure: CARDIOVERSION;  Surgeon: Laurey Morale, MD;  Location: [facility] ENDOSCOPY;  Service: Cardiovascular;  Laterality: N/A;  . TUBAL LIGATION       Current Outpatient Medications  Medication Sig Dispense Refill  . albuterol (VENTOLIN HFA) 108 (90 Base) MCG/ACT inhaler Inhale 2 puffs into the lungs every 6 (six) hours as needed for wheezing or shortness of breath.    . amphetamine-dextroamphetamine (ADDERALL) 20 MG tablet Take 20 mg by mouth 3 (three) times daily.    . bisoprolol (ZEBETA) 5 MG tablet Take 0.5 tablets (2.5 mg total) by mouth daily. 30 tablet 1  . celecoxib  (CELEBREX) 200 MG capsule Take 200 mg by mouth at bedtime as needed (Arthritis).    . citalopram (CELEXA) 20 MG tablet Take 20 mg by mouth at bedtime.    . fluticasone (FLONASE) 50 MCG/ACT nasal spray Place 2 sprays into both nostrils daily as needed for allergies or rhinitis.     Marland Kitchen losartan (COZAAR) 50 MG tablet Take 1 tablet (50 mg total) by mouth daily. 30 tablet 3  . Multiple Vitamins-Calcium (ONE-A-DAY WOMENS PO) Take 1 tablet by mouth daily.     . rivaroxaban (XARELTO) 20 MG TABS tablet TAKE 1 TABLET BY MOUTH DAILY WITH SUPPER 30 tablet 6  . SYMBICORT 80-4.5 MCG/ACT inhaler Inhale 2 puffs into the lungs daily as needed (seasonal allergies).     No current facility-administered medications for this visit.    Allergies:   Morphine and related and Percocet [oxycodone-acetaminophen]   Social History:  The patient  reports that she has never smoked. She has never used smokeless tobacco. She reports current alcohol use of about 7.0 standard drinks of alcohol per week. She reports that she does not use drugs.   Family History:  The patient's  family history includes Diabetes in her mother; Glaucoma in her mother; Heart Problems in her brother.    ROS:  Please see the history of present illness.   All other systems are personally reviewed and negative.    PHYSICAL EXAM: VS:  BP 110/72   Pulse (!) 104   Ht 5\' 8"  (1.727 m)   Wt 206 lb 12.8 oz (93.8 kg)  SpO2 98%   BMI 31.44 kg/m  , BMI Body mass index is 31.44 kg/m. GEN: Well nourished, well developed, in no acute distress HEENT: normal Neck: no JVD, carotid bruits, or masses Cardiac: iRRR   Respiratory: normal work of breathing GI: soft  MS: no deformity or atrophy Skin: warm and dry  Neuro:  Strength and sensation are intact Psych: euthymic mood, full affect  EKG:  EKG is ordered today. The ekg ordered today is personally reviewed and shows afib   Recent Labs: 07/02/2020: BUN 19; Creatinine, Ser 0.85; Hemoglobin 13.7;  Platelets 261; Potassium 4.3; Sodium 139; TSH 1.673  personally reviewed   Lipid Panel  No results found for: CHOL, TRIG, HDL, CHOLHDL, VLDL, LDLCALC, LDLDIRECT personally reviewed   Wt Readings from Last 3 Encounters:  08/19/20 206 lb 12.8 oz (93.8 kg)  08/18/20 205 lb (93 kg)  07/13/20 203 lb 3.2 oz (92.2 kg)      Other studies personally reviewed: Additional studies/ records that were reviewed today include: prior echo, AF clinic notes  Review of the above records today demonstrates: as above   ASSESSMENT AND PLAN:  1.  Longstanding persistent afib The patient has symptomatic, recurrent  atrial fibrillation. she has failed medical therapy with flecainide. Chads2vasc score is at least 2.  she is anticoagulated with xarelto. Therapeutic strategies for afib including medicine (tikosyn, amiodarone) and ablation were discussed in detail with the patient today. Risk, benefits, and alternatives to EP study and radiofrequency ablation for afib were also discussed in detail today. These risks include but are not limited to stroke, bleeding, vascular damage, tamponade, perforation, damage to the esophagus, lungs, and other structures, pulmonary vein stenosis, worsening renal function, and death. The patient understands these risk and wishes to proceed.  We will therefore proceed with catheter ablation at the next available time.  Carto, ICE, anesthesia are requested for the procedure.  Will also obtain cardiac CT prior to the procedure to exclude LAA thrombus and further evaluate atrial anatomy.  2. HTN Stable No change required today  3. Snoring Sleep study through [facility] is pending    Signed, Jahnia Hewes, MD  08/19/2020 11:39 AM     [facility] [address] [place] [address] [phone] (office) [phone] (fax)  

## 2020-08-19 NOTE — Patient Instructions (Addendum)
Medication Instructions:  Your physician recommends that you continue on your current medications as directed. Please refer to the Current Medication list given to you today.  Labwork: CBC, BMP  Testing/Procedures: Your physician has recommended that you have an ablation. Catheter ablation is a medical procedure used to treat some cardiac arrhythmias (irregular heartbeats). During catheter ablation, a long, thin, flexible tube is put into a blood vessel in your groin (upper thigh), or neck. This tube is called an ablation catheter. It is then guided to your heart through the blood vessel. Radio frequency waves destroy small areas of heart tissue where abnormal heartbeats may cause an arrhythmia to start. Please see the instruction sheet given to you today.   Follow-Up: Your physician wants you to follow-up in: 6 months with the Afib Clinic. They will contact you to schedule.   Any Other Special Instructions Will Be Listed Below (If Applicable).  If you need a refill on your cardiac medications before your next appointment, please call your pharmacy.    Cardiac Ablation Cardiac ablation is a procedure to destroy (ablate) some heart tissue that is sending bad signals. These bad signals cause problems in heart rhythm. The heart has many areas that make these signals. If there are problems in these areas, they can make the heart beat in a way that is not normal. Destroying some tissues can help make the heart rhythm normal. Tell your doctor about:  Any allergies you have.  All medicines you are taking. These include vitamins, herbs, eye drops, creams, and over-the-counter medicines.  Any problems you or family members have had with medicines that make you fall asleep (anesthetics).  Any blood disorders you have.  Any surgeries you have had.  Any medical conditions you have, such as kidney failure.  Whether you are pregnant or may be pregnant. What are the risks? This is a safe  procedure. But problems may occur, including:  Infection.  Bruising and bleeding.  Bleeding into the chest.  Stroke or blood clots.  Damage to nearby areas of your body.  Allergies to medicines or dyes.  The need for a pacemaker if the normal system is damaged.  Failure of the procedure to treat the problem. What happens before the procedure? Medicines Ask your doctor about:  Changing or stopping your normal medicines. This is important.  Taking aspirin and ibuprofen. Do not take these medicines unless your doctor tells you to take them.  Taking other medicines, vitamins, herbs, and supplements. General instructions  Follow instructions from your doctor about what you cannot eat or drink.  Plan to have someone take you home from the hospital or clinic.  If you will be going home right after the procedure, plan to have someone with you for 24 hours.  Ask your doctor what steps will be taken to prevent infection. What happens during the procedure?  An IV tube will be put into one of your veins.  You will be given a medicine to help you relax.  The skin on your neck or groin will be numbed.  A cut (incision) will be made in your neck or groin. A needle will be put through your cut and into a large vein.  A tube (catheter) will be put into the needle. The tube will be moved to your heart.  Dye may be put through the tube. This helps your doctor see your heart.  Small devices (electrodes) on the tube will send out signals.  A type of energy will  be used to destroy some heart tissue.  The tube will be taken out.  Pressure will be held on your cut. This helps stop bleeding.  A bandage will be put over your cut. The exact procedure may vary among doctors and hospitals.   What happens after the procedure?  You will be watched until you leave the hospital or clinic. This includes checking your heart rate, breathing rate, oxygen, and blood pressure.  Your cut will  be watched for bleeding. You will need to lie still for a few hours.  Do not drive for 24 hours or as long as your doctor tells you. Summary  Cardiac ablation is a procedure to destroy some heart tissue. This is done to treat heart rhythm problems.  Tell your doctor about any medical conditions you may have. Tell him or her about all medicines you are taking to treat them.  This is a safe procedure. But problems may occur. These include infection, bruising, bleeding, and damage to nearby areas of your body.  Follow what your doctor tells you about food and drink. You may also be told to change or stop some of your medicines.  After the procedure, do not drive for 24 hours or as long as your doctor tells you. This information is not intended to replace advice given to you by your health care provider. Make sure you discuss any questions you have with your health care provider. Document Revised: 03/07/2019 Document Reviewed: 03/07/2019 Elsevier Patient Education  2021 [employer].

## 2020-08-19 NOTE — H&P (View-Only) (Signed)
Electrophysiology Office Note   Date:  08/19/2020   ID:  Dana Ortiz, DOB 1970/11/21, MRN [id number]  PCP:  Rebecka Apley, NP  Cardiologist:  Dr Diona Browner Primary Electrophysiologist: Hillis Range, MD    CC: afib   History of Present Illness: Dana Ortiz is a 50 y.o. female who presents today for electrophysiology evaluation.   She is referred by Dr Diona Browner and Jorja Loa. The patient reports being diagnosed with atrial fibrillation several years ago.  She has been followed in the AF clinic since at least 2018.  She reports symptoms of fatigue and decreased exercise tolerance with afib.  She underwent cardioversion in March but quickly returned to afib.  Her symptoms were better in sinus. She has failed medical therapy with flecainide.  She has moderaet to severe LA enlargement.  Today, she denies symptoms of palpitations, chest pain, shortness of breath, orthopnea, PND, lower extremity edema, claudication, dizziness, presyncope, syncope, bleeding, or neurologic sequela. The patient is tolerating medications without difficulties and is otherwise without complaint today.    Past Medical History:  Diagnosis Date  . Asthma   . Depression   . Essential hypertension   . Paroxysmal atrial fibrillation [facility])    Past Surgical History:  Procedure Laterality Date  . CARDIOVERSION N/A 07/06/2020   Procedure: CARDIOVERSION;  Surgeon: Laurey Morale, MD;  Location: [facility] ENDOSCOPY;  Service: Cardiovascular;  Laterality: N/A;  . TUBAL LIGATION       Current Outpatient Medications  Medication Sig Dispense Refill  . albuterol (VENTOLIN HFA) 108 (90 Base) MCG/ACT inhaler Inhale 2 puffs into the lungs every 6 (six) hours as needed for wheezing or shortness of breath.    . amphetamine-dextroamphetamine (ADDERALL) 20 MG tablet Take 20 mg by mouth 3 (three) times daily.    . bisoprolol (ZEBETA) 5 MG tablet Take 0.5 tablets (2.5 mg total) by mouth daily. 30 tablet 1  . celecoxib  (CELEBREX) 200 MG capsule Take 200 mg by mouth at bedtime as needed (Arthritis).    . citalopram (CELEXA) 20 MG tablet Take 20 mg by mouth at bedtime.    . fluticasone (FLONASE) 50 MCG/ACT nasal spray Place 2 sprays into both nostrils daily as needed for allergies or rhinitis.     Marland Kitchen losartan (COZAAR) 50 MG tablet Take 1 tablet (50 mg total) by mouth daily. 30 tablet 3  . Multiple Vitamins-Calcium (ONE-A-DAY WOMENS PO) Take 1 tablet by mouth daily.     . rivaroxaban (XARELTO) 20 MG TABS tablet TAKE 1 TABLET BY MOUTH DAILY WITH SUPPER 30 tablet 6  . SYMBICORT 80-4.5 MCG/ACT inhaler Inhale 2 puffs into the lungs daily as needed (seasonal allergies).     No current facility-administered medications for this visit.    Allergies:   Morphine and related and Percocet [oxycodone-acetaminophen]   Social History:  The patient  reports that she has never smoked. She has never used smokeless tobacco. She reports current alcohol use of about 7.0 standard drinks of alcohol per week. She reports that she does not use drugs.   Family History:  The patient's  family history includes Diabetes in her mother; Glaucoma in her mother; Heart Problems in her brother.    ROS:  Please see the history of present illness.   All other systems are personally reviewed and negative.    PHYSICAL EXAM: VS:  BP 110/72   Pulse (!) 104   Ht 5\' 8"  (1.727 m)   Wt 206 lb 12.8 oz (93.8 kg)  SpO2 98%   BMI 31.44 kg/m  , BMI Body mass index is 31.44 kg/m. GEN: Well nourished, well developed, in no acute distress HEENT: normal Neck: no JVD, carotid bruits, or masses Cardiac: iRRR   Respiratory: normal work of breathing GI: soft  MS: no deformity or atrophy Skin: warm and dry  Neuro:  Strength and sensation are intact Psych: euthymic mood, full affect  EKG:  EKG is ordered today. The ekg ordered today is personally reviewed and shows afib   Recent Labs: 07/02/2020: BUN 19; Creatinine, Ser 0.85; Hemoglobin 13.7;  Platelets 261; Potassium 4.3; Sodium 139; TSH 1.673  personally reviewed   Lipid Panel  No results found for: CHOL, TRIG, HDL, CHOLHDL, VLDL, LDLCALC, LDLDIRECT personally reviewed   Wt Readings from Last 3 Encounters:  08/19/20 206 lb 12.8 oz (93.8 kg)  08/18/20 205 lb (93 kg)  07/13/20 203 lb 3.2 oz (92.2 kg)      Other studies personally reviewed: Additional studies/ records that were reviewed today include: prior echo, AF clinic notes  Review of the above records today demonstrates: as above   ASSESSMENT AND PLAN:  1.  Longstanding persistent afib The patient has symptomatic, recurrent  atrial fibrillation. she has failed medical therapy with flecainide. Chads2vasc score is at least 2.  she is anticoagulated with xarelto. Therapeutic strategies for afib including medicine (tikosyn, amiodarone) and ablation were discussed in detail with the patient today. Risk, benefits, and alternatives to EP study and radiofrequency ablation for afib were also discussed in detail today. These risks include but are not limited to stroke, bleeding, vascular damage, tamponade, perforation, damage to the esophagus, lungs, and other structures, pulmonary vein stenosis, worsening renal function, and death. The patient understands these risk and wishes to proceed.  We will therefore proceed with catheter ablation at the next available time.  Carto, ICE, anesthesia are requested for the procedure.  Will also obtain cardiac CT prior to the procedure to exclude LAA thrombus and further evaluate atrial anatomy.  2. HTN Stable No change required today  3. Snoring Sleep study through [facility] is pending    Signed, Hillis Range, MD  08/19/2020 11:39 AM     [facility] [address] [place] [address] [phone] (office) [phone] (fax)

## 2020-08-20 LAB — CBC WITH DIFFERENTIAL/PLATELET
Basophils Absolute: 0.1 10*3/uL (ref 0.0–0.2)
Basos: 1 %
EOS (ABSOLUTE): 0.7 10*3/uL — ABNORMAL HIGH (ref 0.0–0.4)
Eos: 11 %
Hematocrit: 41.2 % (ref 34.0–46.6)
Hemoglobin: 14 g/dL (ref 11.1–15.9)
Immature Grans (Abs): 0 10*3/uL (ref 0.0–0.1)
Immature Granulocytes: 0 %
Lymphocytes Absolute: 2.5 10*3/uL (ref 0.7–3.1)
Lymphs: 38 %
MCH: 29.8 pg (ref 26.6–33.0)
MCHC: 34 g/dL (ref 31.5–35.7)
MCV: 88 fL (ref 79–97)
Monocytes Absolute: 0.5 10*3/uL (ref 0.1–0.9)
Monocytes: 8 %
Neutrophils Absolute: 2.7 10*3/uL (ref 1.4–7.0)
Neutrophils: 42 %
Platelets: 255 10*3/uL (ref 150–450)
RBC: 4.7 x10E6/uL (ref 3.77–5.28)
RDW: 12.1 % (ref 11.7–15.4)
WBC: 6.5 10*3/uL (ref 3.4–10.8)

## 2020-08-20 LAB — BASIC METABOLIC PANEL
BUN/Creatinine Ratio: 26 — ABNORMAL HIGH (ref 9–23)
BUN: 23 mg/dL (ref 6–24)
CO2: 24 mmol/L (ref 20–29)
Calcium: 9.3 mg/dL (ref 8.7–10.2)
Chloride: 104 mmol/L (ref 96–106)
Creatinine, Ser: 0.87 mg/dL (ref 0.57–1.00)
Glucose: 97 mg/dL (ref 65–99)
Potassium: 4 mmol/L (ref 3.5–5.2)
Sodium: 141 mmol/L (ref 134–144)
eGFR: 81 mL/min/{1.73_m2} (ref >59)

## 2020-08-31 ENCOUNTER — Telehealth: Payer: Self-pay

## 2020-08-31 NOTE — Telephone Encounter (Signed)
Left a message for the pt to call back re: changing her Cardiac CT to a TEE prior to her Afib Ablation this week... May 19.

## 2020-08-31 NOTE — Telephone Encounter (Signed)
Left a message for the pt to call back  Pt to have TEE morning of her Ablation 09/03/20 to arrive by 7 am.

## 2020-08-31 NOTE — Telephone Encounter (Signed)
Dana Ortiz is returning Dana Ortiz's call. I advised her of the message in regards to her TEE before her ablation and that is reads to arrive by 7. She stated she was already aware of this and if no further info is needed to be given a callback is not necessary.

## 2020-08-31 NOTE — Telephone Encounter (Signed)
Spoke with the pt and she agrees to a TEE this week instead of her cardiac ct due to national contrast dye shortage.. Nursing Supervisor in Endo working on seeing if we can do the morning of her Ablation 09/03/20 and then I will let the pt know for sure.

## 2020-09-01 ENCOUNTER — Telehealth: Payer: Self-pay | Admitting: *Deleted

## 2020-09-01 ENCOUNTER — Other Ambulatory Visit (HOSPITAL_COMMUNITY): Payer: Medicaid - Out of State

## 2020-09-01 NOTE — Telephone Encounter (Signed)
Unable to leave voicemail, box is full.  Calling patient to let her know that her procedure is canceled for 09/03/20. TEE and Ablation. Dr. Johney Frame and I will look at his schedule and get her rescheduled.

## 2020-09-01 NOTE — Telephone Encounter (Signed)
LM for the pt and advised I will not keep calling since she has been getting a lot of calls from [place] but wanted to be sure that she knew that her Covid test today had been cancelled.   Pt to call back if she has any further questions.

## 2020-09-03 ENCOUNTER — Ambulatory Visit (HOSPITAL_COMMUNITY): Admission: RE | Admit: 2020-09-03 | Payer: Medicaid - Out of State | Source: Home / Self Care | Admitting: Cardiology

## 2020-09-03 ENCOUNTER — Ambulatory Visit (HOSPITAL_COMMUNITY)
Admission: RE | Admit: 2020-09-03 | Payer: Medicaid - Out of State | Source: Home / Self Care | Admitting: Internal Medicine

## 2020-09-03 ENCOUNTER — Encounter (HOSPITAL_COMMUNITY): Admission: RE | Payer: Self-pay | Source: Home / Self Care

## 2020-09-03 SURGERY — ATRIAL FIBRILLATION ABLATION
Anesthesia: General

## 2020-09-03 SURGERY — ECHOCARDIOGRAM, TRANSESOPHAGEAL
Anesthesia: Monitor Anesthesia Care

## 2020-09-07 NOTE — Telephone Encounter (Signed)
Rescheduled Ablation and TEE for June 24. Patient in agreement and verbalized understanding.

## 2020-09-07 NOTE — Telephone Encounter (Signed)
Left message to call back  

## 2020-09-09 MED ORDER — SODIUM CHLORIDE 0.9 % IV SOLN: INTRAVENOUS | Status: DC

## 2020-09-09 NOTE — Telephone Encounter (Signed)
TEE tomorrow ablation Friday. Patient has ablation instructions already will send TEE over mychart. Patient agreeable.

## 2020-09-10 ENCOUNTER — Ambulatory Visit (HOSPITAL_COMMUNITY)
Admission: RE | Admit: 2020-09-10 | Discharge: 2020-09-10 | Disposition: A | Discharge status: Home / Self Care | Payer: Medicaid - Out of State | Attending: Cardiology | Admitting: Cardiology

## 2020-09-10 ENCOUNTER — Ambulatory Visit (HOSPITAL_COMMUNITY): Payer: Medicaid - Out of State | Admitting: Anesthesiology

## 2020-09-10 ENCOUNTER — Other Ambulatory Visit (HOSPITAL_COMMUNITY): Payer: Self-pay | Admitting: *Deleted

## 2020-09-10 ENCOUNTER — Encounter (HOSPITAL_COMMUNITY): Payer: Self-pay | Admitting: Cardiology

## 2020-09-10 ENCOUNTER — Encounter (HOSPITAL_COMMUNITY)
Admission: RE | Disposition: A | Discharge status: Home / Self Care | Payer: Self-pay | Source: Home / Self Care | Attending: Cardiology

## 2020-09-10 ENCOUNTER — Ambulatory Visit (HOSPITAL_BASED_OUTPATIENT_CLINIC_OR_DEPARTMENT_OTHER): Payer: Medicaid - Out of State

## 2020-09-10 DIAGNOSIS — R0683 Snoring: Secondary | ICD-10-CM | POA: Insufficient documentation

## 2020-09-10 DIAGNOSIS — I700 Atherosclerosis of aorta: Secondary | ICD-10-CM | POA: Diagnosis not present

## 2020-09-10 DIAGNOSIS — Z7901 Long term (current) use of anticoagulants: Secondary | ICD-10-CM | POA: Diagnosis not present

## 2020-09-10 DIAGNOSIS — Z79899 Other long term (current) drug therapy: Secondary | ICD-10-CM | POA: Diagnosis not present

## 2020-09-10 DIAGNOSIS — I4811 Longstanding persistent atrial fibrillation: Secondary | ICD-10-CM | POA: Insufficient documentation

## 2020-09-10 DIAGNOSIS — I4891 Unspecified atrial fibrillation: Secondary | ICD-10-CM

## 2020-09-10 DIAGNOSIS — I340 Nonrheumatic mitral (valve) insufficiency: Secondary | ICD-10-CM

## 2020-09-10 DIAGNOSIS — I10 Essential (primary) hypertension: Secondary | ICD-10-CM | POA: Insufficient documentation

## 2020-09-10 DIAGNOSIS — Z885 Allergy status to narcotic agent status: Secondary | ICD-10-CM | POA: Insufficient documentation

## 2020-09-10 HISTORY — PX: TEE WITHOUT CARDIOVERSION: SHX5443

## 2020-09-10 LAB — ECHO TEE
MV M vel: 4.53 m/s
MV Peak grad: 82.1 mmHg
Radius: 0.5 cm

## 2020-09-10 SURGERY — ECHOCARDIOGRAM, TRANSESOPHAGEAL
Anesthesia: Monitor Anesthesia Care

## 2020-09-10 MED ORDER — LIDOCAINE HCL (CARDIAC) PF 100 MG/5ML IV SOSY
PREFILLED_SYRINGE | INTRAVENOUS | Status: DC | PRN
  Administered 2020-09-10: 60 mg via INTRATRACHEAL

## 2020-09-10 MED ORDER — BUTAMBEN-TETRACAINE-BENZOCAINE 2-2-14 % EX AERO
INHALATION_SPRAY | CUTANEOUS | Status: DC | PRN
  Administered 2020-09-10: 2 via TOPICAL

## 2020-09-10 MED ORDER — SODIUM CHLORIDE 0.9 % IV SOLN: INTRAVENOUS | Status: DC | PRN

## 2020-09-10 MED ORDER — PROPOFOL 500 MG/50ML IV EMUL
INTRAVENOUS | Status: DC | PRN
  Administered 2020-09-10: 125 ug/kg/min via INTRAVENOUS

## 2020-09-10 MED ORDER — GLYCOPYRROLATE 0.2 MG/ML IJ SOLN
INTRAMUSCULAR | Status: DC | PRN
  Administered 2020-09-10: .1 mg via INTRAVENOUS

## 2020-09-10 MED ORDER — SODIUM CHLORIDE 0.9 % IV SOLN: INTRAVENOUS | Status: DC

## 2020-09-10 MED ORDER — PROPOFOL 10 MG/ML IV BOLUS
INTRAVENOUS | Status: DC | PRN
  Administered 2020-09-10: 20 mg via INTRAVENOUS

## 2020-09-10 NOTE — Anesthesia Preprocedure Evaluation (Addendum)
Anesthesia Evaluation  Patient identified by MRN, date of birth, ID band Patient awake    Reviewed: Allergy & Precautions, NPO status , Patient's Chart, lab work & pertinent test results, reviewed documented beta blocker date and time   Airway Mallampati: II  TM Distance: >3 FB Neck ROM: Full    Dental no notable dental hx. (+) Teeth Intact, Dental Advisory Given   Pulmonary asthma ,    Pulmonary exam normal breath sounds clear to auscultation       Cardiovascular hypertension, Pt. on home beta blockers and Pt. on medications Normal cardiovascular exam+ dysrhythmias Atrial Fibrillation  Rhythm:Regular Rate:Normal  TTE 2022 1. Left ventricular ejection fraction, by estimation, is approximately  50%. The left ventricle has low normal function. The left ventricle  demonstrates global hypokinesis. There is mild left ventricular  hypertrophy. Left ventricular diastolic parameters  are indeterminate.  2. Right ventricular systolic function is normal. The right ventricular  size is normal. There is normal pulmonary artery systolic pressure. The  estimated right ventricular systolic pressure is 26.4 mmHg.  3. Left atrial size was moderately to severely dilated.  4. Right atrial size was upper normal.  5. There is a trivial pericardial effusion posterior to the left  ventricle.  6. The mitral valve is grossly normal. Mild to moderate mitral valve  regurgitation made up of two jets.  7. The aortic valve is tricuspid. Aortic valve regurgitation is not  visualized.  8. The inferior vena cava is normal in size with greater than 50%  respiratory variability, suggesting right atrial pressure of 3 mmHg.  9. Cannot exclude small left to right interatrial shunt, possible PFO.   Neuro/Psych PSYCHIATRIC DISORDERS Depression negative neurological ROS     GI/Hepatic negative GI ROS, Neg liver ROS,   Endo/Other  negative endocrine  ROS  Renal/GU negative Renal ROS  negative genitourinary   Musculoskeletal negative musculoskeletal ROS (+)   Abdominal   Peds  Hematology  (+) Blood dyscrasia (on xarelto), ,   Anesthesia Other Findings TEE for pre-ablation work up   Reproductive/Obstetrics                            Anesthesia Physical Anesthesia Plan  ASA: III  Anesthesia Plan: MAC   Post-op Pain Management:    Induction: Intravenous  PONV Risk Score and Plan: 2 and Propofol infusion and Treatment may vary due to age or medical condition  Airway Management Planned: Natural Airway  Additional Equipment:   Intra-op Plan:   Post-operative Plan:   Informed Consent: I have reviewed the patients History and Physical, chart, labs and discussed the procedure including the risks, benefits and alternatives for the proposed anesthesia with the patient or authorized representative who has indicated his/her understanding and acceptance.     Dental advisory given  Plan Discussed with: CRNA  Anesthesia Plan Comments:         Anesthesia Quick Evaluation

## 2020-09-10 NOTE — H&P (Signed)
Office Visit  08/19/2020 [facility] [address] Office   Dana Range, MD  Cardiology  Persistent atrial fibrillation (HCC) +3 more  Dx  Referred by Danice Goltz, PA  Reason for Visit    Additional Documentation  Vitals:  BP 110/72  Pulse 104Important   Ht 5\' 8"  (1.727 m)  Wt 93.8 kg  SpO2 98%  BMI 31.44 kg/m  BSA 2.12 m  Flowsheets:  NEWS,  MEWS Score,  Anthropometrics    Encounter Info:  Billing Info,  History,  Allergies,  Detailed Report     All Notes   Progress Notes by , MD at 08/19/2020 11:00 AM  Author: 10/19/2020, MD Author Type: Physician Filed: 08/19/2020 12:43 PM  Note Status: Signed Cosign: Cosign Not Required Encounter Date: 08/19/2020  Editor: 10/19/2020, MD (Physician)             Expand AllCollapse All      Electrophysiology Office Note   Date:  08/19/2020   ID:  10/19/2020, DOB February 07, 1971, MRN [id number]  PCP:  [id number], NP       Cardiologist:  Dr Rebecka Apley Primary Electrophysiologist: Diona Browner, MD       CC: afib   History of Present Illness: Dana Ortiz is a 50 y.o. female who presents today for electrophysiology evaluation.   She is referred by Dr 44 and Diona Browner. The patient reports being diagnosed with atrial fibrillation several years ago.  She has been followed in the AF clinic since at least 2018.  She reports symptoms of fatigue and decreased exercise tolerance with afib.  She underwent cardioversion in March but quickly returned to afib.  Her symptoms were better in sinus. She has failed medical therapy with flecainide.  She has moderaet to severe LA enlargement.  Today, she denies symptoms of palpitations, chest pain, shortness of breath, orthopnea, PND, lower extremity edema, claudication, dizziness, presyncope, syncope, bleeding, or neurologic sequela. The patient is tolerating medications without difficulties and is otherwise without complaint today.        Past  Medical History:  Diagnosis Date  . Asthma   . Depression   . Essential hypertension   . Paroxysmal atrial fibrillation [facility])         Past Surgical History:  Procedure Laterality Date  . CARDIOVERSION N/A 07/06/2020   Procedure: CARDIOVERSION;  Surgeon: 07/08/2020, MD;  Location: [facility] ENDOSCOPY;  Service: Cardiovascular;  Laterality: N/A;  . TUBAL LIGATION             Current Outpatient Medications  Medication Sig Dispense Refill  . albuterol (VENTOLIN HFA) 108 (90 Base) MCG/ACT inhaler Inhale 2 puffs into the lungs every 6 (six) hours as needed for wheezing or shortness of breath.    . amphetamine-dextroamphetamine (ADDERALL) 20 MG tablet Take 20 mg by mouth 3 (three) times daily.    . bisoprolol (ZEBETA) 5 MG tablet Take 0.5 tablets (2.5 mg total) by mouth daily. 30 tablet 1  . celecoxib (CELEBREX) 200 MG capsule Take 200 mg by mouth at bedtime as needed (Arthritis).    . citalopram (CELEXA) 20 MG tablet Take 20 mg by mouth at bedtime.    . fluticasone (FLONASE) 50 MCG/ACT nasal spray Place 2 sprays into both nostrils daily as needed for allergies or rhinitis.     [facility] losartan (COZAAR) 50 MG tablet Take 1 tablet (50 mg total) by mouth daily. 30 tablet 3  . Multiple Vitamins-Calcium (ONE-A-DAY WOMENS PO) Take 1 tablet by  mouth daily.     . rivaroxaban (XARELTO) 20 MG TABS tablet TAKE 1 TABLET BY MOUTH DAILY WITH SUPPER 30 tablet 6  . SYMBICORT 80-4.5 MCG/ACT inhaler Inhale 2 puffs into the lungs daily as needed (seasonal allergies).     No current facility-administered medications for this visit.    Allergies:   Morphine and related and Percocet [oxycodone-acetaminophen]   Social History:  The patient  reports that she has never smoked. She has never used smokeless tobacco. She reports current alcohol use of about 7.0 standard drinks of alcohol per week. She reports that she does not use drugs.   Family History:  The patient's  family history  includes Diabetes in her mother; Glaucoma in her mother; Heart Problems in her brother.    ROS:  Please see the history of present illness.   All other systems are personally reviewed and negative.    PHYSICAL EXAM: VS:  BP 110/72   Pulse (!) 104   Ht 5\' 8"  (1.727 m)   Wt 206 lb 12.8 oz (93.8 kg)   SpO2 98%   BMI 31.44 kg/m  , BMI Body mass index is 31.44 kg/m. GEN: Well nourished, well developed, in no acute distress HEENT: normal Neck: no JVD, carotid bruits, or masses Cardiac: iRRR   Respiratory: normal work of breathing GI: soft  MS: no deformity or atrophy Skin: warm and dry  Neuro:  Strength and sensation are intact Psych: euthymic mood, full affect  EKG:  EKG is ordered today. The ekg ordered today is personally reviewed and shows afib   Recent Labs: 07/02/2020: BUN 19; Creatinine, Ser 0.85; Hemoglobin 13.7; Platelets 261; Potassium 4.3; Sodium 139; TSH 1.673  personally reviewed   Lipid Panel  Labs (Brief)  No results found for: CHOL, TRIG, HDL, CHOLHDL, VLDL, LDLCALC, LDLDIRECT   personally reviewed      Wt Readings from Last 3 Encounters:  08/19/20 206 lb 12.8 oz (93.8 kg)  08/18/20 205 lb (93 kg)  07/13/20 203 lb 3.2 oz (92.2 kg)      Other studies personally reviewed: Additional studies/ records that were reviewed today include: prior echo, AF clinic notes  Review of the above records today demonstrates: as above   ASSESSMENT AND PLAN:  1.  Longstanding persistent afib The patient has symptomatic, recurrent  atrial fibrillation. she has failed medical therapy with flecainide. Chads2vasc score is at least 2.  she is anticoagulated with xarelto. Therapeutic strategies for afib including medicine (tikosyn, amiodarone) and ablation were discussed in detail with the patient today. Risk, benefits, and alternatives to EP study and radiofrequency ablation for afib were also discussed in detail today. These risks include but are not limited  to stroke, bleeding, vascular damage, tamponade, perforation, damage to the esophagus, lungs, and other structures, pulmonary vein stenosis, worsening renal function, and death. The patient understands these risk and wishes to proceed.  We will therefore proceed with catheter ablation at the next available time.  Carto, ICE, anesthesia are requested for the procedure.  Will also obtain cardiac CT prior to the procedure to exclude LAA thrombus and further evaluate atrial anatomy.  2. HTN Stable No change required today  3. Snoring Sleep study through [place] is pending    Signed, Natrona heights, MD  08/19/2020 11:39 AM     [facility] [address] [place] [phone] (office) [phone] (fax)        For TEE pre atrial fibrillation ablation. No changes [phone]

## 2020-09-10 NOTE — Progress Notes (Signed)
  Echocardiogram Echocardiogram Transesophageal has been performed.  Janalyn Harder 09/10/2020, 2:39 PM

## 2020-09-10 NOTE — Anesthesia Postprocedure Evaluation (Signed)
Anesthesia Post Note  Patient: Dana Ortiz  Procedure(s) Performed: TRANSESOPHAGEAL ECHOCARDIOGRAM (TEE) (N/A )     Patient location during evaluation: Endoscopy Anesthesia Type: MAC Level of consciousness: awake and alert Pain management: pain level controlled Vital Signs Assessment: post-procedure vital signs reviewed and stable Respiratory status: spontaneous breathing, nonlabored ventilation, respiratory function stable and patient connected to nasal cannula oxygen Cardiovascular status: blood pressure returned to baseline and stable Postop Assessment: no apparent nausea or vomiting Anesthetic complications: no   No complications documented.  Last Vitals:  Vitals:   09/10/20 1426 09/10/20 1436  BP: 117/62 124/65  Pulse: 98 86  Resp: 10 15  Temp:    SpO2: 96% 96%    Last Pain:  Vitals:   09/10/20 1436  TempSrc:   PainSc: 0-No pain                 Cooper Stamp L Glendola Friedhoff

## 2020-09-10 NOTE — Transfer of Care (Signed)
Immediate Anesthesia Transfer of Care Note  Patient: Dana Ortiz  Procedure(s) Performed: TRANSESOPHAGEAL ECHOCARDIOGRAM (TEE) (N/A )  Patient Location: Endoscopy Unit  Anesthesia Type:MAC  Level of Consciousness: drowsy and patient cooperative  Airway & Oxygen Therapy: Patient Spontanous Breathing and Patient connected to nasal cannula oxygen  Post-op Assessment: Report given to RN and Post -op Vital signs reviewed and stable  Post vital signs: Reviewed and stable  Last Vitals:  Vitals Value Taken Time  BP 102/74 09/10/20 1416  Temp 36.5 C 09/10/20 1416  Pulse 88 09/10/20 1417  Resp 19 09/10/20 1417  SpO2 98 % 09/10/20 1417  Vitals shown include unvalidated device data.  Last Pain:  Vitals:   09/10/20 1416  TempSrc: Oral  PainSc: 0-No pain         Complications: No complications documented.

## 2020-09-10 NOTE — Progress Notes (Signed)
    Transesophageal Echocardiogram Note  Dana Ortiz [id number] [birth year]  Procedure: Transesophageal Echocardiogram Indications: atrial fibrillation  Procedure Details Consent: Obtained Time Out: Verified patient identification, verified procedure, site/side was marked, verified correct patient position, special equipment/implants available, Radiology Safety Procedures followed,  medications/allergies/relevent history reviewed, required imaging and test results available.  Performed  Medications:  Pt sedated by anesthesia with diprovan 140 mg IV total.  Normal LV function; mild prolapse posterior MV leaflet; moderate MR; moderate LAE; no LAA thrombus.   Complications: No apparent complications Patient did tolerate procedure well.  [name], MD

## 2020-09-10 NOTE — Discharge Instructions (Signed)
TEE  YOU HAD AN CARDIAC PROCEDURE TODAY: Refer to the procedure report and other information in the discharge instructions given to you for any specific questions about what was found during the examination. If this information does not answer your questions, please call [facility] office at [phone] to clarify.   DIET: Your first meal following the procedure should be a light meal and then it is ok to progress to your normal diet. A half-sandwich or bowl of soup is an example of a good first meal. Heavy or fried foods are harder to digest and may make you feel nauseous or bloated. Drink plenty of fluids but you should avoid alcoholic beverages for 24 hours.   ACTIVITY: Your care partner should take you home directly after the procedure. You should plan to take it easy, moving slowly for the rest of the day. You can resume normal activity the day after the procedure however YOU SHOULD NOT DRIVE, use power tools, machinery or perform tasks that involve climbing or major physical exertion for 24 hours (because of the sedation medicines used during the test).   SYMPTOMS TO REPORT IMMEDIATELY: A cardiologist can be reached at any hour. Please call [phone] for any of the following symptoms:  Vomiting of blood or coffee ground material  New, significant abdominal pain  New, significant chest pain or pain under the shoulder blades  Painful or persistently difficult swallowing  New shortness of breath  Black, tarry-looking or red, bloody stools  FOLLOW UP:  Please also call with any specific questions about appointments or follow up tests.   

## 2020-09-10 NOTE — Anesthesia Procedure Notes (Signed)
Procedure Name: MAC Date/Time: 09/10/2020 1:55 PM Performed by: Kathryne Hitch, CRNA Pre-anesthesia Checklist: Patient identified, Emergency Drugs available, Suction available and Patient being monitored Patient Re-evaluated:Patient Re-evaluated prior to induction Oxygen Delivery Method: Nasal cannula Induction Type: IV induction Placement Confirmation: positive ETCO2 Dental Injury: Teeth and Oropharynx as per pre-operative assessment

## 2020-09-11 ENCOUNTER — Ambulatory Visit (HOSPITAL_COMMUNITY)
Admission: RE | Admit: 2020-09-11 | Discharge: 2020-09-12 | Disposition: A | Discharge status: Home / Self Care | Payer: Medicaid - Out of State | Attending: Internal Medicine | Admitting: Internal Medicine

## 2020-09-11 ENCOUNTER — Encounter (HOSPITAL_COMMUNITY): Payer: Self-pay | Admitting: Internal Medicine

## 2020-09-11 ENCOUNTER — Other Ambulatory Visit: Payer: Self-pay

## 2020-09-11 ENCOUNTER — Ambulatory Visit (HOSPITAL_COMMUNITY)
Admission: RE | Disposition: A | Discharge status: Home / Self Care | Payer: Self-pay | Source: Home / Self Care | Attending: Internal Medicine

## 2020-09-11 ENCOUNTER — Ambulatory Visit (HOSPITAL_COMMUNITY): Payer: Medicaid - Out of State | Admitting: Anesthesiology

## 2020-09-11 DIAGNOSIS — Z885 Allergy status to narcotic agent status: Secondary | ICD-10-CM | POA: Insufficient documentation

## 2020-09-11 DIAGNOSIS — Z7951 Long term (current) use of inhaled steroids: Secondary | ICD-10-CM | POA: Diagnosis not present

## 2020-09-11 DIAGNOSIS — Z79899 Other long term (current) drug therapy: Secondary | ICD-10-CM | POA: Insufficient documentation

## 2020-09-11 DIAGNOSIS — I4811 Longstanding persistent atrial fibrillation: Secondary | ICD-10-CM | POA: Diagnosis not present

## 2020-09-11 DIAGNOSIS — I4819 Other persistent atrial fibrillation: Secondary | ICD-10-CM | POA: Diagnosis present

## 2020-09-11 DIAGNOSIS — I110 Hypertensive heart disease with heart failure: Secondary | ICD-10-CM | POA: Diagnosis not present

## 2020-09-11 DIAGNOSIS — R0683 Snoring: Secondary | ICD-10-CM | POA: Insufficient documentation

## 2020-09-11 HISTORY — PX: ATRIAL FIBRILLATION ABLATION: EP1191

## 2020-09-11 LAB — POCT ACTIVATED CLOTTING TIME
Activated Clotting Time: 338 seconds
Activated Clotting Time: 339 seconds

## 2020-09-11 SURGERY — ATRIAL FIBRILLATION ABLATION
Anesthesia: General

## 2020-09-11 MED ORDER — HEPARIN SODIUM (PORCINE) 1000 UNIT/ML IJ SOLN
INTRAMUSCULAR | Status: AC
  Filled 2020-09-11: qty 2

## 2020-09-11 MED ORDER — CITALOPRAM HYDROBROMIDE 20 MG PO TABS
20.0000 mg | ORAL_TABLET | Freq: Every day | ORAL | Status: DC
  Administered 2020-09-11: 20 mg via ORAL
  Filled 2020-09-11: qty 1

## 2020-09-11 MED ORDER — PROPOFOL 10 MG/ML IV BOLUS
INTRAVENOUS | Status: DC | PRN
  Administered 2020-09-11: 150 mg via INTRAVENOUS

## 2020-09-11 MED ORDER — HEPARIN SODIUM (PORCINE) 1000 UNIT/ML IJ SOLN
INTRAMUSCULAR | Status: DC | PRN
  Administered 2020-09-11: 1000 [IU] via INTRAVENOUS
  Administered 2020-09-11: 2000 [IU] via INTRAVENOUS

## 2020-09-11 MED ORDER — SODIUM CHLORIDE 0.9 % IV SOLN: INTRAVENOUS | Status: DC

## 2020-09-11 MED ORDER — ONDANSETRON HCL 4 MG/2ML IJ SOLN
INTRAMUSCULAR | Status: DC | PRN
  Administered 2020-09-11: 4 mg via INTRAVENOUS

## 2020-09-11 MED ORDER — HEPARIN (PORCINE) IN NACL 2-0.9 UNITS/ML
INTRAMUSCULAR | Status: AC | PRN
  Administered 2020-09-11: 500 mL

## 2020-09-11 MED ORDER — DEXAMETHASONE SODIUM PHOSPHATE 4 MG/ML IJ SOLN
INTRAMUSCULAR | Status: DC | PRN
  Administered 2020-09-11: 5 mg via INTRAVENOUS

## 2020-09-11 MED ORDER — PANTOPRAZOLE SODIUM 40 MG PO TBEC: 40.0000 mg | DELAYED_RELEASE_TABLET | Freq: Every day | ORAL | 0 refills | Status: DC

## 2020-09-11 MED ORDER — HEPARIN SODIUM (PORCINE) 1000 UNIT/ML IJ SOLN
INTRAMUSCULAR | Status: DC | PRN
  Administered 2020-09-11: 15000 [IU] via INTRAVENOUS
  Administered 2020-09-11: 1000 [IU] via INTRAVENOUS

## 2020-09-11 MED ORDER — ACETAMINOPHEN 325 MG PO TABS
650.0000 mg | ORAL_TABLET | Freq: Once | ORAL | Status: AC
  Administered 2020-09-11: 650 mg via ORAL
  Filled 2020-09-11: qty 2

## 2020-09-11 MED ORDER — ROCURONIUM BROMIDE 10 MG/ML (PF) SYRINGE
PREFILLED_SYRINGE | INTRAVENOUS | Status: DC | PRN
  Administered 2020-09-11: 50 mg via INTRAVENOUS
  Administered 2020-09-11: 20 mg via INTRAVENOUS

## 2020-09-11 MED ORDER — LIDOCAINE 2% (20 MG/ML) 5 ML SYRINGE
INTRAMUSCULAR | Status: DC | PRN
  Administered 2020-09-11: 60 mg via INTRAVENOUS

## 2020-09-11 MED ORDER — CELECOXIB 200 MG PO CAPS
200.0000 mg | ORAL_CAPSULE | Freq: Every day | ORAL | Status: DC
  Administered 2020-09-11: 200 mg via ORAL
  Filled 2020-09-11 (×2): qty 1

## 2020-09-11 MED ORDER — ONDANSETRON HCL 4 MG/2ML IJ SOLN: 4.0000 mg | Freq: Four times a day (QID) | INTRAMUSCULAR | Status: DC | PRN

## 2020-09-11 MED ORDER — MIDAZOLAM HCL 5 MG/5ML IJ SOLN
INTRAMUSCULAR | Status: DC | PRN
  Administered 2020-09-11: 2 mg via INTRAVENOUS

## 2020-09-11 MED ORDER — ACETAMINOPHEN 325 MG PO TABS: 650.0000 mg | ORAL_TABLET | ORAL | Status: DC | PRN

## 2020-09-11 MED ORDER — PHENYLEPHRINE HCL-NACL 10-0.9 MG/250ML-% IV SOLN
INTRAVENOUS | Status: DC | PRN
  Administered 2020-09-11: 50 ug/min via INTRAVENOUS

## 2020-09-11 MED ORDER — RIVAROXABAN 20 MG PO TABS
20.0000 mg | ORAL_TABLET | Freq: Every day | ORAL | Status: DC
  Administered 2020-09-11: 20 mg via ORAL
  Filled 2020-09-11: qty 1

## 2020-09-11 MED ORDER — ACETAMINOPHEN 325 MG PO TABS
ORAL_TABLET | ORAL | Status: AC
  Filled 2020-09-11: qty 2

## 2020-09-11 MED ORDER — FENTANYL CITRATE (PF) 100 MCG/2ML IJ SOLN
INTRAMUSCULAR | Status: DC | PRN
  Administered 2020-09-11 (×2): 50 ug via INTRAVENOUS

## 2020-09-11 MED ORDER — SODIUM CHLORIDE 0.9% FLUSH: 3.0000 mL | INTRAVENOUS | Status: DC | PRN

## 2020-09-11 MED ORDER — FLUTICASONE FUROATE-VILANTEROL 100-25 MCG/INH IN AEPB
1.0000 | INHALATION_SPRAY | Freq: Every day | RESPIRATORY_TRACT | Status: DC
  Administered 2020-09-12: 1 via RESPIRATORY_TRACT
  Filled 2020-09-11: qty 28

## 2020-09-11 MED ORDER — SODIUM CHLORIDE 0.9% FLUSH: 3.0000 mL | Freq: Two times a day (BID) | INTRAVENOUS | Status: DC

## 2020-09-11 MED ORDER — LOSARTAN POTASSIUM 50 MG PO TABS: 50.0000 mg | ORAL_TABLET | Freq: Every day | ORAL | Status: DC

## 2020-09-11 MED ORDER — HYDROCODONE-ACETAMINOPHEN 5-325 MG PO TABS
1.0000 | ORAL_TABLET | ORAL | Status: DC | PRN
  Administered 2020-09-11: 2 via ORAL
  Administered 2020-09-12: 1 via ORAL
  Administered 2020-09-12: 2 via ORAL
  Filled 2020-09-11 (×2): qty 2
  Filled 2020-09-11: qty 1

## 2020-09-11 MED ORDER — SUGAMMADEX SODIUM 200 MG/2ML IV SOLN
INTRAVENOUS | Status: DC | PRN
  Administered 2020-09-11: 200 mg via INTRAVENOUS

## 2020-09-11 MED ORDER — ONDANSETRON HCL 4 MG/2ML IJ SOLN
4.0000 mg | Freq: Four times a day (QID) | INTRAMUSCULAR | Status: DC | PRN
  Administered 2020-09-11: 4 mg via INTRAVENOUS
  Filled 2020-09-11: qty 2

## 2020-09-11 MED ORDER — ALBUTEROL SULFATE HFA 108 (90 BASE) MCG/ACT IN AERS: 2.0000 | INHALATION_SPRAY | Freq: Four times a day (QID) | RESPIRATORY_TRACT | Status: DC | PRN

## 2020-09-11 MED ORDER — SODIUM CHLORIDE 0.9 % IV SOLN: 250.0000 mL | INTRAVENOUS | Status: DC | PRN

## 2020-09-11 MED ORDER — BISOPROLOL FUMARATE 5 MG PO TABS
2.5000 mg | ORAL_TABLET | Freq: Every day | ORAL | Status: DC
  Administered 2020-09-11: 2.5 mg via ORAL
  Filled 2020-09-11: qty 1

## 2020-09-11 MED ORDER — BISOPROLOL FUMARATE 5 MG PO TABS: 2.5000 mg | ORAL_TABLET | Freq: Every day | ORAL | Status: DC

## 2020-09-11 MED ORDER — PROTAMINE SULFATE 10 MG/ML IV SOLN
INTRAVENOUS | Status: DC | PRN
  Administered 2020-09-11: 40 mg via INTRAVENOUS

## 2020-09-11 MED ORDER — ANGIOPLASTY BOOK
Status: AC
  Filled 2020-09-11: qty 1

## 2020-09-11 SURGICAL SUPPLY — 16 items
BLANKET WARM UNDERBOD FULL ACC (MISCELLANEOUS) ×2 IMPLANT
CATH MAPPNG PENTARAY F 2-6-2MM (CATHETERS) ×1 IMPLANT
CATH SMTCH THERMOCOOL SF DF (CATHETERS) ×2 IMPLANT
CATH SOUNDSTAR ECO 8FR (CATHETERS) ×2 IMPLANT
CATH WEBSTER BI DIR CS D-F CRV (CATHETERS) ×2 IMPLANT
CLOSURE PERCLOSE PROSTYLE (VASCULAR PRODUCTS) ×12 IMPLANT
COVER SWIFTLINK CONNECTOR (BAG) ×2 IMPLANT
PACK EP LATEX FREE (CUSTOM PROCEDURE TRAY) ×1
PACK EP LF (CUSTOM PROCEDURE TRAY) ×1 IMPLANT
PAD PRO RADIOLUCENT 2001M-C (PAD) ×2 IMPLANT
PATCH CARTO3 (PAD) ×2 IMPLANT
PENTARAY F 2-6-2MM (CATHETERS) ×2
SHEATH PINNACLE 7F 10CM (SHEATH) ×4 IMPLANT
SHEATH PINNACLE 9F 10CM (SHEATH) ×2 IMPLANT
SHEATH PROBE COVER 6X72 (BAG) ×2 IMPLANT
TUBING SMART ABLATE COOLFLOW (TUBING) ×2 IMPLANT

## 2020-09-11 NOTE — Progress Notes (Signed)
Report called and client transferred via stretcher to 6-E-23

## 2020-09-11 NOTE — Progress Notes (Addendum)
Spoke with Dr. Johney Frame by phone, order obtained for groin and chest discomfort, also ok for pt to use own symbicort (pt states family has med), RN unable to find med in [employer] system, safety maintained

## 2020-09-11 NOTE — Anesthesia Preprocedure Evaluation (Signed)
Anesthesia Evaluation  Patient identified by MRN, date of birth, ID band Patient awake    Reviewed: Allergy & Precautions, NPO status , Patient's Chart, lab work & pertinent test results, reviewed documented beta blocker date and time   History of Anesthesia Complications Negative for: history of anesthetic complications  Airway Mallampati: II  TM Distance: >3 FB Neck ROM: Full    Dental  (+) Teeth Intact   Pulmonary asthma ,    Pulmonary exam normal        Cardiovascular hypertension, Pt. on medications and Pt. on home beta blockers Normal cardiovascular exam+ dysrhythmias Atrial Fibrillation      Neuro/Psych Depression negative neurological ROS     GI/Hepatic negative GI ROS, Neg liver ROS,   Endo/Other  negative endocrine ROS  Renal/GU negative Renal ROS  negative genitourinary   Musculoskeletal negative musculoskeletal ROS (+)   Abdominal   Peds  Hematology negative hematology ROS (+) Xarelto   Anesthesia Other Findings   Reproductive/Obstetrics                             Anesthesia Physical Anesthesia Plan  ASA: III  Anesthesia Plan: General   Post-op Pain Management:    Induction: Intravenous  PONV Risk Score and Plan: 3 and Ondansetron, Dexamethasone, Treatment may vary due to age or medical condition and Midazolam  Airway Management Planned: Oral ETT  Additional Equipment: None  Intra-op Plan:   Post-operative Plan: Extubation in OR  Informed Consent: I have reviewed the patients History and Physical, chart, labs and discussed the procedure including the risks, benefits and alternatives for the proposed anesthesia with the patient or authorized representative who has indicated his/her understanding and acceptance.     Dental advisory given  Plan Discussed with:   Anesthesia Plan Comments:         Anesthesia Quick Evaluation

## 2020-09-11 NOTE — Anesthesia Procedure Notes (Signed)
Procedure Name: Intubation Date/Time: 09/11/2020 10:50 AM Performed by: Caren Macadam, CRNA Pre-anesthesia Checklist: Patient identified, Emergency Drugs available, Suction available and Patient being monitored Patient Re-evaluated:Patient Re-evaluated prior to induction Oxygen Delivery Method: Circle system utilized Preoxygenation: Pre-oxygenation with 100% oxygen Induction Type: IV induction Ventilation: Mask ventilation without difficulty Laryngoscope Size: Miller and 2 Grade View: Grade I Tube type: Oral Tube size: 7.0 mm Number of attempts: 1 Airway Equipment and Method: Stylet Placement Confirmation: ETT inserted through vocal cords under direct vision,  positive ETCO2 and breath sounds checked- equal and bilateral Secured at: 22 cm Tube secured with: Tape Dental Injury: Teeth and Oropharynx as per pre-operative assessment

## 2020-09-11 NOTE — Transfer of Care (Signed)
Immediate Anesthesia Transfer of Care Note  Patient: Dana Ortiz  Procedure(s) Performed: ATRIAL FIBRILLATION ABLATION (N/A )  Patient Location: Cath Lab  Anesthesia Type:General  Level of Consciousness: awake  Airway & Oxygen Therapy: Patient Spontanous Breathing and Patient connected to face mask oxygen  Post-op Assessment: Report given to RN and Post -op Vital signs reviewed and stable  Post vital signs: Reviewed and stable  Last Vitals:  Vitals Value Taken Time  BP 102/46 09/11/20 1310  Temp    Pulse 73 09/11/20 1315  Resp 20 09/11/20 1315  SpO2 91 % 09/11/20 1315  Vitals shown include unvalidated device data.  Last Pain:  Vitals:   09/11/20 0848  TempSrc:   PainSc: 6          Complications: No complications documented.

## 2020-09-11 NOTE — Interval H&P Note (Signed)
History and Physical Interval Note:  09/11/2020 10:24 AM  Dana Ortiz  has presented today for surgery, with the diagnosis of afib.  The various methods of treatment have been discussed with the patient and family. After consideration of risks, benefits and other options for treatment, the patient has consented to  Procedure(s): ATRIAL FIBRILLATION ABLATION (N/A) as a surgical intervention.  The patient's history has been reviewed, patient examined, no change in status, stable for surgery.  I have reviewed the patient's chart and labs.  Questions were answered to the patient's satisfaction.     Risk, benefits, and alternatives to EP study and radiofrequency ablation for afib were again discussed in detail today. These risks include but are not limited to stroke, bleeding, vascular damage, tamponade, perforation, damage to the esophagus, lungs, and other structures, pulmonary vein stenosis, worsening renal function, and death. The patient understands these risk and wishes to proceed.    TEE reviewed at length with the patient today.  she reports compliance with OAC without interruption.  Hillis Range MD, [facility] 09/11/2020 10:25 AM    Hillis Range

## 2020-09-11 NOTE — Anesthesia Postprocedure Evaluation (Signed)
Anesthesia Post Note  Patient: Ourania Hamler  Procedure(s) Performed: ATRIAL FIBRILLATION ABLATION (N/A )     Patient location during evaluation: PACU Anesthesia Type: General Level of consciousness: awake and alert Pain management: pain level controlled Vital Signs Assessment: post-procedure vital signs reviewed and stable Respiratory status: spontaneous breathing, nonlabored ventilation and respiratory function stable Cardiovascular status: blood pressure returned to baseline and stable Postop Assessment: no apparent nausea or vomiting Anesthetic complications: no   No complications documented.  Last Vitals:  Vitals:   09/11/20 1350 09/11/20 1355  BP: 125/62   Pulse: 69 66  Resp: 17 (!) 6  Temp:    SpO2: 99% 97%    Last Pain:  Vitals:   09/11/20 1345  TempSrc: Temporal  PainSc:                  Lucretia Kern

## 2020-09-11 NOTE — Discharge Instructions (Signed)
Post procedure care instructions No driving for 4 days. No lifting over 5 lbs for 1 week. No vigorous or sexual activity for 1 week. You may return to work/your usual activities on 09/19/20. Keep procedure site clean & dry. If you notice increased pain, swelling, bleeding or pus, call/return!  You may shower after 24 hours, but no soaking in baths/hot tubs/pools for 1 week.     Cardiac Ablation, Care After  This sheet gives you information about how to care for yourself after your procedure. Your health care provider may also give you more specific instructions. If you have problems or questions, contact your health care provider. What can I expect after the procedure? After the procedure, it is common to have:  Bruising around your puncture site.  Tenderness around your puncture site.  Skipped heartbeats.  Tiredness (fatigue).  Follow these instructions at home: Puncture site care   Follow instructions from your health care provider about how to take care of your puncture site. Make sure you: ? If present, leave stitches (sutures), skin glue, or adhesive strips in place. These skin closures may need to stay in place for up to 2 weeks. If adhesive strip edges start to loosen and curl up, you may trim the loose edges. Do not remove adhesive strips completely unless your health care provider tells you to do that. ? If a large square bandage is present, this may be removed 24 hours after surgery.   Check your puncture site every day for signs of infection. Check for: ? Redness, swelling, or pain. ? Fluid or blood. If your puncture site starts to bleed, lie down on your back, apply firm pressure to the area, and contact your health care provider. ? Warmth. ? Pus or a bad smell. Driving  Do not drive for at least 4 days after your procedure or however long your health care provider recommends. (Do not resume driving if you have previously been instructed not to drive for other health  reasons.)  Do not drive or use heavy machinery while taking prescription pain medicine. Activity  Avoid activities that take a lot of effort for at least 7 days after your procedure.  Do not lift anything that is heavier than 5 lb (4.5 kg) for one week.   No sexual activity for 1 week.   Return to your normal activities as told by your health care provider. Ask your health care provider what activities are safe for you. General instructions  Take over-the-counter and prescription medicines only as told by your health care provider.  Do not use any products that contain nicotine or tobacco, such as cigarettes and e-cigarettes. If you need help quitting, ask your health care provider.  You may shower after 24 hours, but Do not take baths, swim, or use a hot tub for 1 week.   Do not drink alcohol for 24 hours after your procedure.  Keep all follow-up visits as told by your health care provider. This is important. Contact a health care provider if:  You have redness, mild swelling, or pain around your puncture site.  You have fluid or blood coming from your puncture site that stops after applying firm pressure to the area.  Your puncture site feels warm to the touch.  You have pus or a bad smell coming from your puncture site.  You have a fever.  You have chest pain or discomfort that spreads to your neck, jaw, or arm.  You are sweating a lot.    You feel nauseous.  You have a fast or irregular heartbeat.  You have shortness of breath.  You are dizzy or light-headed and feel the need to lie down.  You have pain or numbness in the arm or leg closest to your puncture site. Get help right away if:  Your puncture site suddenly swells.  Your puncture site is bleeding and the bleeding does not stop after applying firm pressure to the area. These symptoms may represent a serious problem that is an emergency. Do not wait to see if the symptoms will go away. Get medical help  right away. Call your local emergency services (911 in the [place] Do not drive yourself to the hospital. Summary  After the procedure, it is normal to have bruising and tenderness at the puncture site in your groin, neck, or forearm.  Check your puncture site every day for signs of infection.  Get help right away if your puncture site is bleeding and the bleeding does not stop after applying firm pressure to the area. This is a medical emergency. This information is not intended to replace advice given to you by your health care provider. Make sure you discuss any questions you have with your health care provider.   You have an appointment set up with the Atrial Fibrillation Clinic.  Multiple studies have shown that being followed by a dedicated atrial fibrillation clinic in addition to the standard care you receive from your other physicians improves health. We believe that enrollment in the atrial fibrillation clinic will allow us to better care for you.   The phone number to the Atrial Fibrillation Clinic is [phone]. The clinic is staffed Monday through Friday from 8:30am to 5pm.  Parking Directions: The clinic is located in the Heart and Vascular Building connected to [place] [facility]. 1)From [address] turn on to [address] and go to the 3rd entrance  (Heart and Vascular entrance) on the right. 2)Look to the right for Heart &Vascular Parking Garage. 3)A code for the entrance is required, for June is 4233 4)Take the elevators to the 1st floor. Registration is in the room with the glass walls at the end of the hallway.  If you have any trouble parking or locating the clinic, please don't hesitate to call [phone].   

## 2020-09-11 NOTE — Progress Notes (Signed)
Attempt to get client up for discharge and client states can't sit up due to pain right groin; right groin soft and no bleeding; Dr Johney Frame notified and orders to admit

## 2020-09-12 DIAGNOSIS — I4819 Other persistent atrial fibrillation: Secondary | ICD-10-CM

## 2020-09-12 DIAGNOSIS — I4811 Longstanding persistent atrial fibrillation: Secondary | ICD-10-CM | POA: Diagnosis not present

## 2020-09-12 MED ORDER — MENTHOL 3 MG MT LOZG: 1.0000 | LOZENGE | OROMUCOSAL | Status: DC | PRN

## 2020-09-12 NOTE — Plan of Care (Signed)

## 2020-09-12 NOTE — Discharge Summary (Signed)
ELECTROPHYSIOLOGY PROCEDURE DISCHARGE SUMMARY    Patient ID: Dana Ortiz,  MRN: [id number], DOB/AGE: 51-Jan-1972 51 y.o.  Admit date: 09/11/2020 Discharge date: 09/12/2020  Electrophysiologist: Hillis Range, MD  Primary Discharge Diagnosis:  Persistent atrial fibrillation  Secondary Discharge Diagnosis:  Essential hypertension  Procedures This Admission:  1.  Electrophysiology study and radiofrequency catheter ablation on 09/11/20 by Dr Hillis Range.  This study demonstrated successful PVI and posterior wall ablation.  Brief HPI: Dana Ortiz is a 50 y.o. female with a history of persistent atrial fibrillation.  They have failed medical therapy.  Risks, benefits, and alternatives to catheter ablation of atrial fibrillation were reviewed with the patient who wished to proceed.  The patient underwent TEE the day prior to the procedure which demonstrated normal LV function and no LAA thrombus.    Hospital Course:  The patient was admitted and underwent EPS/RFCA of atrial fibrillation with details as outlined above.  She had groin discomfort due to local tissue bruising.  Dr Johney Frame spoke with the patient and felt that she was stable for discharge.  Wound care and restrictions were reviewed with the patient.  The patient will be seen back by Rudi Coco, NP in 4 weeks and Dr Johney Frame in 12 weeks for post ablation follow up.   This patients CHA2DS2-VASc Score and unadjusted Ischemic Stroke Rate (% per year) is equal to 2.2 % stroke rate/year from a score of 2 Above score calculated as 1 point each if present [CHF, HTN, DM, Vascular=MI/PAD/Aortic Plaque, Age if 65-74, or Female] Above score calculated as 2 points each if present [Age > 75, or Stroke/TIA/TE]   Physical Exam: Vitals:   09/11/20 1908 09/11/20 1938 09/11/20 2355 09/12/20 0843  BP: (!) 101/48 113/70 108/62 (!) 98/51  Pulse: 65 83 74 (!) 58  Resp: 16 18 16 18   Temp: 97.8 F (36.6 C)  98.4 F (36.9 C)   TempSrc: Oral   Oral   SpO2: 94% 95% 95%   Weight:      Height:        GEN- well sounding, alert, no distress. Virtual discussion performed with patient by Dr .  Labs:   Lab Results  Component Value Date   WBC 6.5 08/19/2020   HGB 14.0 08/19/2020   HCT 41.2 08/19/2020   MCV 88 08/19/2020   PLT 255 08/19/2020   No results for input(s): NA, K, CL, CO2, BUN, CREATININE, CALCIUM, PROT, BILITOT, ALKPHOS, ALT, AST, GLUCOSE in the last 168 hours.  Invalid input(s): LABALBU   Discharge Medications:  Allergies as of 09/12/2020      Reactions   Morphine And Related Itching   Percocet [oxycodone-acetaminophen] Other (See Comments)   Starts to develop headaches if on this for awhile      Medication List    STOP taking these medications   metoprolol tartrate 50 MG tablet Commonly known as: LOPRESSOR     TAKE these medications   albuterol 108 (90 Base) MCG/ACT inhaler Commonly known as: VENTOLIN HFA Inhale 2 puffs into the lungs every 6 (six) hours as needed for wheezing or shortness of breath.   amphetamine-dextroamphetamine 20 MG tablet Commonly known as: ADDERALL Take 20 mg by mouth 3 (three) times daily.   bisoprolol 5 MG tablet Commonly known as: ZEBETA Take 0.5 tablets (2.5 mg total) by mouth daily.   BLACK COHOSH PO Take 1 capsule by mouth daily.   celecoxib 200 MG capsule Commonly known as: CELEBREX Take 200 mg by mouth at bedtime.  citalopram 20 MG tablet Commonly known as: CELEXA Take 20 mg by mouth at bedtime.   fluticasone 50 MCG/ACT nasal spray Commonly known as: FLONASE Place 2 sprays into both nostrils daily as needed for allergies or rhinitis.   losartan 50 MG tablet Commonly known as: COZAAR Take 1 tablet (50 mg total) by mouth daily.   magnesium oxide 400 MG tablet Commonly known as: MAG-OX Take 400 mg by mouth daily.   milk thistle 175 MG tablet Take 350 mg by mouth daily.   ONE-A-DAY WOMENS PO Take 1 tablet by mouth daily.   pantoprazole 40  MG tablet Commonly known as: Protonix Take 1 tablet (40 mg total) by mouth daily.   rivaroxaban 20 MG Tabs tablet Commonly known as: Xarelto TAKE 1 TABLET BY MOUTH DAILY WITH SUPPER What changed:   how much to take  how to take this  when to take this   Symbicort 80-4.5 MCG/ACT inhaler Generic drug: budesonide-formoterol Inhale 2 puffs into the lungs daily as needed (seasonal allergies).       Disposition:    Follow-up Information    [facility] Follow up.   Specialty: Cardiology Why: 10/07/20 @ 10:30AM with R. Fenton, PA-C Contact information: [address] [id number] mc [address] [place] [address] [phone]       Hillis Range, MD Follow up.   Specialty: Cardiology Why: 12/11/20 @ 2:15PM Contact information: [address] [place] [address] [phone]               Duration of Discharge Encounter: Greater than 30 minutes including physician time.  Randolm Idol 09/12/2020 9:55 AM

## 2020-09-13 ENCOUNTER — Encounter (HOSPITAL_COMMUNITY): Payer: Self-pay | Admitting: Cardiology

## 2020-09-15 ENCOUNTER — Encounter (HOSPITAL_COMMUNITY): Payer: Self-pay | Admitting: Internal Medicine

## 2020-09-15 MED FILL — Lidocaine HCl Local Preservative Free (PF) Inj 1%: INTRAMUSCULAR | Qty: 30 | Status: AC

## 2020-10-01 ENCOUNTER — Ambulatory Visit (HOSPITAL_COMMUNITY): Payer: Medicaid - Out of State | Admitting: Physician Assistant

## 2020-10-07 ENCOUNTER — Other Ambulatory Visit: Payer: Self-pay

## 2020-10-07 ENCOUNTER — Encounter (HOSPITAL_COMMUNITY): Payer: Self-pay | Admitting: Physician Assistant

## 2020-10-07 ENCOUNTER — Ambulatory Visit (HOSPITAL_COMMUNITY)
Admission: RE | Admit: 2020-10-07 | Discharge: 2020-10-07 | Disposition: A | Discharge status: Home / Self Care | Payer: Medicaid - Out of State | Source: Ambulatory Visit | Attending: Physician Assistant | Admitting: Physician Assistant

## 2020-10-07 VITALS — BP 112/72 | HR 61 | Ht 68.0 in | Wt 195.6 lb

## 2020-10-07 DIAGNOSIS — I4811 Longstanding persistent atrial fibrillation: Secondary | ICD-10-CM | POA: Insufficient documentation

## 2020-10-07 DIAGNOSIS — Z79899 Other long term (current) drug therapy: Secondary | ICD-10-CM | POA: Diagnosis not present

## 2020-10-07 DIAGNOSIS — R0683 Snoring: Secondary | ICD-10-CM | POA: Diagnosis not present

## 2020-10-07 DIAGNOSIS — I10 Essential (primary) hypertension: Secondary | ICD-10-CM | POA: Diagnosis not present

## 2020-10-07 DIAGNOSIS — Z7901 Long term (current) use of anticoagulants: Secondary | ICD-10-CM | POA: Diagnosis not present

## 2020-10-07 DIAGNOSIS — Z791 Long term (current) use of non-steroidal anti-inflammatories (NSAID): Secondary | ICD-10-CM | POA: Insufficient documentation

## 2020-10-07 NOTE — Progress Notes (Signed)
Primary Care Physician: Rebecka Apley, NP Primary Cardiologist: Dr Diona Browner Primary Electrophysiologist: Dr Johney Frame Referring Physician: Dr Alvan Dame Marvelous Woolford is a 50 y.o. female with a history of HTN and atrial fibrillation who presents for follow up in the [facility]. Last seen by Rudi Coco in 2018. Patient is on Xarelto for a CHADS2VASC score of 2. She was seen by Dr Diona Browner on 05/12/20 and was believed to be in persistent atrial fibrillation for several months at least, if not longer, per the patient's Apple Watch. She does have symptoms of fatigue and dyspnea with activity while in afib. She has been intolerant of metoprolol and diltiazem in the past due to side effects. She denies significant alcohol use but does admit to snoring, daytime somnolence, and witnessed apnea. Patient is s/p DCCV on 07/06/20 with quick return of her afib just under two days later. She does report she felt "great" for those two days which much more energy.   On follow up today, patient is s/p afib ablation 09/12/20. She reports that she did have some post op CP and groin pain which has now resolved. She states she is feeling much better with more energy and no SOB. She denies any bleeding issues on anticoagulation.   Today, she denies symptoms of palpitations, chest pain, orthopnea, PND, lower extremity edema, presyncope, syncope, bleeding, or neurologic sequela. The patient is tolerating medications without difficulties and is otherwise without complaint today.    Atrial Fibrillation Risk Factors:  she does have symptoms or diagnosis of sleep apnea. she does not have a history of rheumatic fever. she does not have a history of alcohol use. The patient does not have a history of early familial atrial fibrillation or other arrhythmias.  she has a BMI of Body mass index is 29.74 kg/m.Marland Kitchen Filed Weights   10/07/20 1049  Weight: 88.7 kg     Family History   Problem Relation Age of Onset   Diabetes Mother    Glaucoma Mother    Heart Problems Brother      Atrial Fibrillation Management history:  Previous antiarrhythmic drugs: flecainide  Previous cardioversions: 07/06/20 Previous ablations: 09/12/20 CHADS2VASC score: 2 Anticoagulation history: Xarelto   Past Medical History:  Diagnosis Date   Asthma    Depression    Essential hypertension    Paroxysmal atrial fibrillation (HCC)    Past Surgical History:  Procedure Laterality Date   ATRIAL FIBRILLATION ABLATION N/A 09/11/2020   Procedure: ATRIAL FIBRILLATION ABLATION;  Surgeon: Hillis Range, MD;  [facility];  Service: Cardiovascular;  Laterality: N/A;   CARDIOVERSION N/A 07/06/2020   Procedure: CARDIOVERSION;  Surgeon: Laurey Morale, MD;  Location[facility] ENDOSCOPY;  Service: Cardiovascular;  Laterality: N/A;   TEE WITHOUT CARDIOVERSION N/A 09/10/2020   Procedure: TRANSESOPHAGEAL ECHOCARDIOGRAM (TEE);  Surgeon: Lewayne Bunting, MD;  Location: [facility] ENDOSCOPY;  Service: Cardiovascular;  Laterality: N/A;   TUBAL LIGATION      Current Outpatient Medications  Medication Sig Dispense Refill   albuterol (VENTOLIN HFA) 108 (90 Base) MCG/ACT inhaler Inhale 2 puffs into the lungs every 6 (six) hours as needed for wheezing or shortness of breath.     amphetamine-dextroamphetamine (ADDERALL) 20 MG tablet Take 20 mg by mouth 3 (three) times daily.     bisoprolol (ZEBETA) 5 MG tablet Take 0.5 tablets (2.5 mg total) by mouth daily. 30 tablet 1   celecoxib (CELEBREX) 200 MG capsule Take 200 mg by mouth at  bedtime.     citalopram (CELEXA) 20 MG tablet Take 20 mg by mouth at bedtime.     fluticasone (FLONASE) 50 MCG/ACT nasal spray Place 2 sprays into both nostrils daily as needed for allergies or rhinitis.      losartan (COZAAR) 50 MG tablet Take 1 tablet (50 mg total) by mouth daily. 30 tablet 3   magnesium oxide (MAG-OX) 400 MG tablet Take 400 mg by mouth daily.     milk thistle  175 MG tablet Take 350 mg by mouth daily.     Multiple Vitamins-Calcium (ONE-A-DAY WOMENS PO) Take 1 tablet by mouth daily.      rivaroxaban (XARELTO) 20 MG TABS tablet TAKE 1 TABLET BY MOUTH DAILY WITH SUPPER 30 tablet 6   SYMBICORT 80-4.5 MCG/ACT inhaler Inhale 2 puffs into the lungs daily as needed (seasonal allergies).     No current facility-administered medications for this encounter.    Allergies  Allergen Reactions   Morphine And Related Itching    Social History   Socioeconomic History   Marital status: Widowed    Spouse name: Not on file   Number of children: Not on file   Years of education: Not on file   Highest education level: Not on file  Occupational History   Not on file  Tobacco Use   Smoking status: Never   Smokeless tobacco: Never  Substance and Sexual Activity   Alcohol use: Yes    Alcohol/week: 7.0 standard drinks    Types: 7 Glasses of wine per week    Comment: one glass of wine weekly   Drug use: No   Sexual activity: Not on file  Other Topics Concern   Not on file  Social History Narrative   Lives with husband, no children, 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  Physical Exam: Vitals:   10/07/20 1049  BP: 112/72  Pulse: 61  Weight: 88.7 kg  Height: 5\' 8"  (1.727 m)     GEN- The patient is a well appearing female, alert and oriented x 3 today.   HEENT-head normocephalic, atraumatic, sclera clear, conjunctiva pink, hearing intact, trachea midline. Lungs- Clear to ausculation bilaterally, normal work of breathing Heart- Regular rate and rhythm, no murmurs, rubs or gallops  GI- soft, NT, ND, + BS Extremities-  no clubbing, cyanosis, or edema MS- no significant deformity or atrophy Skin- no rash or lesion Psych- euthymic mood, full affect Neuro- strength and sensation are intact   Wt Readings from Last 3 Encounters:  10/07/20 88.7 kg  09/11/20 90.7 kg  09/10/20 90.7 kg    EKG today demonstrates  SR Vent. rate 61 BPM PR interval 164 ms QRS duration 88 ms QT/QTcB 430/432 ms  Echo 07/01/20 demonstrated  1. Left ventricular ejection fraction, by estimation, is approximately  50%. The left ventricle has low normal function. The left ventricle  demonstrates global hypokinesis. There is mild left ventricular  hypertrophy. Left ventricular diastolic parameters   are indeterminate.   2. Right ventricular systolic function is normal. The right ventricular  size is normal. There is normal pulmonary artery systolic pressure. The  estimated right  ventricular systolic pressure is 22.0 mmHg.   3. Left atrial size was moderately to severely dilated.   4. Right atrial size was upper normal.   5. There is a trivial pericardial effusion posterior to the left  ventricle.   6. The mitral valve is grossly normal. Mild to moderate mitral valve  regurgitation made up of two jets.   7. The aortic valve is tricuspid. Aortic valve regurgitation is not  visualized.   8. The inferior vena cava is normal in size with greater than 50%  respiratory variability, suggesting right atrial pressure of 3 mmHg.   9. Cannot exclude small left to right interatrial shunt, possible PFO.   Comparison(s): Echocardiogram done 09/29/16 showed an EF of 55-60%.   Epic records are reviewed at length today  CHA2DS2-VASc Score = 2  The patient's score is based upon: CHF History: No HTN History: Yes Diabetes History: No Stroke History: No Vascular Disease History: No Age Score: 0 Gender Score: 1      ASSESSMENT AND PLAN: 1. Longstanding Persistent Atrial Fibrillation (ICD10:  I48.11) The patient's CHA2DS2-VASc score is  2, indicating a 2.2% annual risk of stroke.   S/p afib ablation 09/12/20 Patient appears to be maintaining SR. Continue bisoprolol 2.5 mg daily Continue Xarelto 20 mg daily with no missed doses for at least 3 months post ablation.    2. Snoring/witnessed apnea/daytime somnolence  Sleep study at [facility] pending.   3. HTN Stable, no changes today.  4. MR Mild to moderate.   Follow up with Nena Polio and Dr Johney Frame as scheduled.    Jorja Loa PA-C [facility] [address] [place], [place] [address] [phone] 10/07/2020 11:35 AM

## 2020-10-09 SURGERY — ECHOCARDIOGRAM, TRANSESOPHAGEAL
Anesthesia: Moderate Sedation

## 2020-10-17 ENCOUNTER — Other Ambulatory Visit (HOSPITAL_COMMUNITY): Payer: Self-pay | Admitting: Physician Assistant

## 2020-10-21 ENCOUNTER — Other Ambulatory Visit (HOSPITAL_COMMUNITY): Payer: Self-pay | Admitting: Physician Assistant

## 2020-11-04 DIAGNOSIS — L03019 Cellulitis of unspecified finger: Secondary | ICD-10-CM | POA: Insufficient documentation

## 2020-11-06 ENCOUNTER — Ambulatory Visit (HOSPITAL_COMMUNITY): Payer: PRIVATE HEALTH INSURANCE | Admitting: Physician Assistant

## 2020-11-18 NOTE — Progress Notes (Signed)
Cardiology Office Note  Date: 11/19/2020   ID: Dana Ortiz, DOB March 18, 1971, MRN [id number]  PCP:  Dana Apley, NP  Cardiologist:  Dana Dell, MD Electrophysiologist:  None   Chief Complaint: 3 month follow up  History of Present Illness: Dana Ortiz is a 50 y.o. female with a history of atrial fibrillation s/p ablation 08/2020, HTN, suspected sleep apnea, mitral regurgitation.  She was last seen by Dana Dana Merl PA on 10/07/2020 at atrial fibrillation clinic. She had been sen by Dr Dana Ortiz in January 2022 and was believed to have been in atrial fibrillation for several months. She had not been tolerant of metoprolol or diltiazem. She complained of fatigue and dyspnea when in atrial fibrillation. She was s/p DCCV on 07/06/2020 which was successful for 2 days but she reverted back to atrial fibrillation.   She had subsequent successful atrial fibrillation ablation on 09/11/2020 by Dr. Johney Ortiz.  She was to continue Xarelto uninterrupted for 3 months post ablation.  During follow up with Dana Dana Ortiz she denied any issues. She was tolerating her medications. She was continuing bisoprolol 2.5 mg po daily, Xarelto 20 mg po daily. She had a pending sleep study. Her BP was stable on current therapy.  She is here today for 11-month follow-up.  She states she has been feeling well since her ablation.  EKG today shows she is remaining in sinus rhythm with rate of 59 sinus bradycardia with sinus arrhythmia.  She states during recent hospitalization for cellulitis her losartan was decreased because of lower blood pressures.  She states she has only been taking the losartan occasionally.  She states her blood pressures have been maintaining in the 120s over 70s with her being off of the losartan.  She denies any palpitations or arrhythmias, CVA or TIA-like symptoms, bleeding.  Denies any DOE or SOB.  States she did have a brief episode of dizziness yesterday which was short-lived.  She denies  any sensation of near syncope or syncopal episode.  She states this had never occurred before.  She denies any claudication-like symptoms, DVT or PE-like symptoms.  She states she was never called regarding her sleep study by [facility] physicians which is apparently where Dana. Ortiz had referred her for suspected sleep apnea.   Past Medical History:  Diagnosis Date   Asthma    Depression    Essential hypertension    Paroxysmal atrial fibrillation [facility])     Past Surgical History:  Procedure Laterality Date   ATRIAL FIBRILLATION ABLATION N/A 09/11/2020   Procedure: ATRIAL FIBRILLATION ABLATION;  Surgeon: Dana Range, MD;  [facility] CV LAB;  Service: Cardiovascular;  Laterality: N/A;   CARDIOVERSION N/A 07/06/2020   Procedure: CARDIOVERSION;  Surgeon: Dana Morale, MD;  Location: [facility] ENDOSCOPY;  Service: Cardiovascular;  Laterality: N/A;   TEE WITHOUT CARDIOVERSION N/A 09/10/2020   Procedure: TRANSESOPHAGEAL ECHOCARDIOGRAM (TEE);  Surgeon: Dana Bunting, MD;  Location: [facility] ENDOSCOPY;  Service: Cardiovascular;  Laterality: N/A;   TUBAL LIGATION      Current Outpatient Medications  Medication Sig Dispense Refill   albuterol (VENTOLIN HFA) 108 (90 Base) MCG/ACT inhaler Inhale 2 puffs into the lungs every 6 (six) hours as needed for wheezing or shortness of breath.     amphetamine-dextroamphetamine (ADDERALL) 20 MG tablet Take 20 mg by mouth 3 (three) times daily.     bisoprolol (ZEBETA) 5 MG tablet TAKE 1/2 TABLET BY MOUTH DAILY (cut in half) 45 tablet 3   celecoxib (CELEBREX) 200  MG capsule Take 200 mg by mouth at bedtime.     citalopram (CELEXA) 20 MG tablet Take 20 mg by mouth at bedtime.     fluticasone (FLONASE) 50 MCG/ACT nasal spray Place 2 sprays into both nostrils daily as needed for allergies or rhinitis.      magnesium oxide (MAG-OX) 400 MG tablet Take 400 mg by mouth daily.     milk thistle 175 MG tablet Take 350 mg by mouth daily.     Multiple Vitamins-Calcium  (ONE-A-DAY WOMENS PO) Take 1 tablet by mouth daily.      rivaroxaban (XARELTO) 20 MG TABS tablet TAKE 1 TABLET BY MOUTH DAILY WITH SUPPER 30 tablet 6   SYMBICORT 80-4.5 MCG/ACT inhaler Inhale 2 puffs into the lungs daily as needed (seasonal allergies).     losartan (COZAAR) 50 MG tablet TAKE 1 TABLET BY MOUTH DAILY (DOSE decrease) (Patient not taking: Reported on 11/19/2020) 30 tablet 3   No current facility-administered medications for this visit.   Allergies:  Morphine and related   Social History: The patient  reports that she has never smoked. She has never used smokeless tobacco. She reports current alcohol use of about 7.0 standard drinks of alcohol per week. She reports that she does not use drugs.   Family History: The patient's family history includes Diabetes in her mother; Glaucoma in her mother; Heart Problems in her brother.   ROS:  Please see the history of present illness. Otherwise, complete review of systems is positive for none.  All other systems are reviewed and negative.   Physical Exam: VS:  BP 122/74   Pulse (!) 58   Ht 5\' 8"  (1.727 m)   Wt 192 lb 3.2 oz (87.2 kg)   SpO2 99%   BMI 29.22 kg/m , BMI Body mass index is 29.22 kg/m.  Wt Readings from Last 3 Encounters:  11/19/20 192 lb 3.2 oz (87.2 kg)  10/07/20 195 lb 9.6 oz (88.7 kg)  09/11/20 200 lb (90.7 kg)    General: Patient appears comfortable at rest. Neck: Supple, no elevated JVP or carotid bruits, no thyromegaly. Lungs: Clear to auscultation, nonlabored breathing at rest. Cardiac: Regular rate and rhythm, no S3 or significant systolic murmur, no pericardial rub. Extremities: No pitting edema, distal pulses 2+. Skin: Warm and dry. Musculoskeletal: No kyphosis. Neuropsychiatric: Alert and oriented x3, affect grossly appropriate.  ECG: 11/19/2020 EKG sinus bradycardia with sinus arrhythmia rate of 59  Recent Labwork: 07/02/2020: TSH 1.673 08/19/2020: BUN 23; Creatinine, Ser 0.87; Hemoglobin 14.0;  Platelets 255; Potassium 4.0; Sodium 141  No results found for: CHOL, TRIG, HDL, CHOLHDL, VLDL, LDLCALC, LDLDIRECT  Other Studies Reviewed Today:   Atrial fibrillation ablation 09/11/2020 Dr. 09/13/2020 CONCLUSIONS: 1. Atrial fibrillation upon presentation.   2. Intracardiac echo reveals a moderate sized left atrium with four separate pulmonary veins without evidence of pulmonary vein stenosis. 3. Successful electrical isolation and anatomical encircling of all four pulmonary veins with radiofrequency current.  A WACA approach was used 3. Additional left atrial ablation was performed with a standard box lesion created along the posterior wall of the left atrium 4. Atrial fibrillation successfully cardioverted to sinus rhythm. 5. No early apparent complications.     Additional instructions: The patient takes xarelto 20mg  daily for stroke prevention.  We will resume xarelto 20mg  this evening.  The patient should not interrupt oral anticoagulation for any reason for 3 months post ablation.     Echo 07/01/20 demonstrated 1. Left ventricular ejection fraction, by estimation,  is approximately  50%. The left ventricle has low normal function. The left ventricle  demonstrates global hypokinesis. There is mild left ventricular  hypertrophy. Left ventricular diastolic parameters   are indeterminate.   2. Right ventricular systolic function is normal. The right ventricular  size is normal. There is normal pulmonary artery systolic pressure. The  estimated right ventricular systolic pressure is 22.0 mmHg.   3. Left atrial size was moderately to severely dilated.   4. Right atrial size was upper normal.   5. There is a trivial pericardial effusion posterior to the left  ventricle.   6. The mitral valve is grossly normal. Mild to moderate mitral valve  regurgitation made up of two jets.   7. The aortic valve is tricuspid. Aortic valve regurgitation is not  visualized.   8. The inferior vena cava is  normal in size with greater than 50%  respiratory variability, suggesting right atrial pressure of 3 mmHg.   9. Cannot exclude small left to right interatrial shunt, possible PFO.   Comparison(s): Echocardiogram done 09/29/16 showed an EF of 55-60%.   Epic records are reviewed at length today   CHA2DS2-VASc Score = 2  The patient's score is based upon: CHF History: No HTN History: Yes Diabetes History: No Stroke History: No Vascular Disease History: No Age Score: 0 Gender Score: 1     Assessment and Plan:  1. Longstanding persistent atrial fibrillation (HCC)   2. Suspected sleep apnea   3. Essential hypertension   4. Mitral valve insufficiency, unspecified etiology    1. Longstanding persistent atrial fibrillation (HCC) History of longstanding atrial fibrillation with history of briefly successful DCCV and March.  She had ablation in May with Dr. Johney Ortiz.  She has been maintaining normal sinus rhythm since that time.  EKG today shows sinus bradycardia with sinus arrhythmia rate of 59.  States she is feeling much better since back in a normal rhythm.  She denies any bleeding on Xarelto.  Continue Xarelto 20 mg daily.  Continue bisoprolol 2.5 mg daily.  2. Suspected sleep apnea Patient states she had been previously referred to [facility] physicians for sleep study.  She states she was never contacted for follow-up to have sleep consultation.  Please refer to Dr. Vassie Loll or Dr. Sherene Sires pulmonology for suspected sleep apnea.  3. Essential hypertension Blood pressure well controlled today.  Patient states she has not been taking her losartan regularly due to lower blood pressures.  She had a recent hospitalization for cellulitis and states her blood pressures were low during that stay and losartan dosage had been decreased.  May discontinue losartan.  Continue to monitor blood pressures to maintain pressure at or below 130/80.  4. Mitral valve insufficiency, unspecified etiology Last  echocardiogram in March showed mild to moderate mitral valve regurgitation of made up of 2 jets.  She is asymptomatic.  Medication Adjustments/Labs and Tests Ordered: Current medicines are reviewed at length with the patient today.  Concerns regarding medicines are outlined above.   Disposition: Follow-up with Dr. Diona Ortiz or APP 6 months  Signed, Rennis Harding, NP 11/19/2020 8:34 AM    [facility] [address] [place], [place], [place] [address] Phone: [phone]; Fax: [phone]

## 2020-11-19 ENCOUNTER — Ambulatory Visit (INDEPENDENT_AMBULATORY_CARE_PROVIDER_SITE_OTHER): Payer: PRIVATE HEALTH INSURANCE | Admitting: Family Medicine

## 2020-11-19 ENCOUNTER — Encounter: Payer: Self-pay | Admitting: Family Medicine

## 2020-11-19 VITALS — BP 122/74 | HR 58 | Ht 68.0 in | Wt 192.2 lb

## 2020-11-19 DIAGNOSIS — I10 Essential (primary) hypertension: Secondary | ICD-10-CM

## 2020-11-19 DIAGNOSIS — I4811 Longstanding persistent atrial fibrillation: Secondary | ICD-10-CM

## 2020-11-19 DIAGNOSIS — R29818 Other symptoms and signs involving the nervous system: Secondary | ICD-10-CM

## 2020-11-19 DIAGNOSIS — I340 Nonrheumatic mitral (valve) insufficiency: Secondary | ICD-10-CM

## 2020-11-19 NOTE — Patient Instructions (Addendum)
Medication Instructions:  Your physician recommends that you continue on your current medications as directed. Please refer to the Current Medication list given to you today.  Labwork: none  Testing/Procedures: none  Follow-Up: Your physician recommends that you schedule a follow-up appointment in: 6 months  Any Other Special Instructions Will Be Listed Below (If Applicable). You have been referred to [facility]  If you need a refill on your cardiac medications before your next appointment, please call your pharmacy. 

## 2020-12-02 ENCOUNTER — Ambulatory Visit: Payer: Medicaid - Out of State | Admitting: Internal Medicine

## 2020-12-07 ENCOUNTER — Telehealth: Payer: Self-pay | Admitting: Cardiology

## 2020-12-07 MED ORDER — RIVAROXABAN 20 MG PO TABS: ORAL_TABLET | ORAL | 6 refills | Status: DC

## 2020-12-07 NOTE — Telephone Encounter (Signed)
Needing on rivaroxaban (XARELTO) 20 MG TABS tablet [[id number]] sent to [facility].   They told her they wouldn't be able to fill what they have on file for another 5 days and pt is out of medicine.

## 2020-12-07 NOTE — Telephone Encounter (Signed)
Prescription refill request for Xarelto received.  Indication: Atrial Fib Last office visit: 11/19/20  Verita Schneiders NP Weight: 87.2kg  Age: 50 Scr: 0.86 on 11/05/20 CrCl: 106.54  Based on above findings Xarelto 20mg  daily is the appropriate dose.  Refill approved.

## 2020-12-11 ENCOUNTER — Other Ambulatory Visit: Payer: Self-pay

## 2020-12-11 ENCOUNTER — Ambulatory Visit (INDEPENDENT_AMBULATORY_CARE_PROVIDER_SITE_OTHER): Payer: PRIVATE HEALTH INSURANCE | Admitting: Internal Medicine

## 2020-12-11 VITALS — BP 110/74 | HR 80 | Ht 68.0 in | Wt 193.0 lb

## 2020-12-11 DIAGNOSIS — I4819 Other persistent atrial fibrillation: Secondary | ICD-10-CM | POA: Diagnosis not present

## 2020-12-11 DIAGNOSIS — I10 Essential (primary) hypertension: Secondary | ICD-10-CM

## 2020-12-11 NOTE — Patient Instructions (Signed)
Medication Instructions:  Your physician recommends that you continue on your current medications as directed. Please refer to the Current Medication list given to you today.  Labwork: None ordered.  Testing/Procedures: None ordered.  Follow-Up: Your physician wants you to follow-up in: 06/16/20 at 4 pm with Dr. Johney Frame.   Any Other Special Instructions Will Be Listed Below (If Applicable).  If you need a refill on your cardiac medications before your next appointment, please call your pharmacy.

## 2020-12-11 NOTE — Progress Notes (Signed)
PCP: Rebecka Apley, NP Primary Cardiologist: Dr Alvan Dame Sadonna Kotara is a 50 y.o. female who presents today for routine electrophysiology followup.  Since his recent afib ablation, the patient reports doing very well.  she denies procedure related complications and is pleased with the results of the procedure.  Today, she denies symptoms of palpitations, exertional chest pain, shortness of breath,  lower extremity edema, dizziness, presyncope, or syncope.   Her concern today is with daytime somnolence and fatigue.  She has a sleep study ordered but has not yet had this performed.  The patient is otherwise without complaint today.   Past Medical History:  Diagnosis Date   Asthma    Depression    Essential hypertension    Paroxysmal atrial fibrillation [facility])    Past Surgical History:  Procedure Laterality Date   ATRIAL FIBRILLATION ABLATION N/A 09/11/2020   Procedure: ATRIAL FIBRILLATION ABLATION;  Surgeon: Hillis Range, MD;  Location: [facility] INVASIVE CV LAB;  Service: Cardiovascular;  Laterality: N/A;   CARDIOVERSION N/A 07/06/2020   Procedure: CARDIOVERSION;  Surgeon: Laurey Morale, MD;  Location: [facility] ENDOSCOPY;  Service: Cardiovascular;  Laterality: N/A;   TEE WITHOUT CARDIOVERSION N/A 09/10/2020   Procedure: TRANSESOPHAGEAL ECHOCARDIOGRAM (TEE);  Surgeon: Lewayne Bunting, MD;  Location: [facility] ENDOSCOPY;  Service: Cardiovascular;  Laterality: N/A;   TUBAL LIGATION      ROS- all systems are personally reviewed and negatives except as per HPI above  Current Outpatient Medications  Medication Sig Dispense Refill   albuterol (VENTOLIN HFA) 108 (90 Base) MCG/ACT inhaler Inhale 2 puffs into the lungs every 6 (six) hours as needed for wheezing or shortness of breath.     amphetamine-dextroamphetamine (ADDERALL) 20 MG tablet Take 20 mg by mouth 3 (three) times daily.     bisoprolol (ZEBETA) 5 MG tablet TAKE 1/2 TABLET BY MOUTH DAILY (cut in half) 45 tablet 3   celecoxib (CELEBREX)  200 MG capsule Take 200 mg by mouth at bedtime.     citalopram (CELEXA) 20 MG tablet Take 20 mg by mouth at bedtime.     fluticasone (FLONASE) 50 MCG/ACT nasal spray Place 2 sprays into both nostrils daily as needed for allergies or rhinitis.      losartan (COZAAR) 50 MG tablet TAKE 1 TABLET BY MOUTH DAILY (DOSE decrease) 30 tablet 3   magnesium oxide (MAG-OX) 400 MG tablet Take 400 mg by mouth daily.     milk thistle 175 MG tablet Take 350 mg by mouth daily.     Multiple Vitamins-Calcium (ONE-A-DAY WOMENS PO) Take 1 tablet by mouth daily.      rivaroxaban (XARELTO) 20 MG TABS tablet TAKE 1 TABLET BY MOUTH DAILY WITH SUPPER 30 tablet 6   SYMBICORT 80-4.5 MCG/ACT inhaler Inhale 2 puffs into the lungs daily as needed (seasonal allergies).     No current facility-administered medications for this visit.    Physical Exam: Vitals:   12/11/20 1434  BP: 110/74  Pulse: 80  SpO2: 98%  Weight: 193 lb (87.5 kg)  Height: 5\' 8"  (1.727 m)    GEN- The patient is well appearing, alert and oriented x 3 today.   Head- normocephalic, atraumatic Eyes-  Sclera clear, conjunctiva pink Ears- hearing intact Oropharynx- clear Lungs- Clear to ausculation bilaterally, normal work of breathing Heart- Regular rate and rhythm, no murmurs, rubs or gallops, PMI not laterally displaced GI- soft, NT, ND, + BS Extremities- no clubbing, cyanosis, or edema  EKG tracing ordered today is personally reviewed  and shows sinus  Assessment and Plan:  1. Persistent atrial fibrillation Doing well s/p ablation chads2vasc score is 2.  She is on xarelto  2. HTN Continue cozaar 50mg  daily We discussed reducing to 25mg  daily. She would prefer to continue current dosing at this time.  3. Snoring Sleep study with [facility] is pending  4. NSAID use I discussed the use of caution with celebrex and we discussed potential adverse effects of this medicine.  She feels that she cannot reduce at this time but will continue to  discus with PCP.   Return to see me in 6 months  MD, [facility] 12/11/2020 2:43 PM

## 2021-01-11 ENCOUNTER — Ambulatory Visit: Payer: PRIVATE HEALTH INSURANCE | Admitting: Internal Medicine

## 2021-01-13 ENCOUNTER — Institutional Professional Consult (permissible substitution): Payer: PRIVATE HEALTH INSURANCE | Admitting: Pulmonary Disease

## 2021-04-07 ENCOUNTER — Ambulatory Visit: Payer: BC Managed Care – PPO | Admitting: Pulmonary Disease

## 2021-04-07 ENCOUNTER — Other Ambulatory Visit: Payer: Self-pay

## 2021-04-07 ENCOUNTER — Encounter: Payer: Self-pay | Admitting: Pulmonary Disease

## 2021-04-07 VITALS — BP 128/78 | HR 75 | Temp 98.2°F | Ht 68.0 in | Wt 195.6 lb

## 2021-04-07 DIAGNOSIS — R0683 Snoring: Secondary | ICD-10-CM

## 2021-04-07 NOTE — Patient Instructions (Signed)
Will arrange for home sleep study Will call to arrange for follow up after sleep study reviewed  

## 2021-04-07 NOTE — Progress Notes (Signed)
[facility]  Chief Complaint  Patient presents with   Consult    Ref. By Levell July NP for sleep apnea.     Past Surgical History:  She  has a past surgical history that includes Tubal ligation; Cardioversion (N/A, 07/06/2020); TEE without cardioversion (N/A, 09/10/2020); ATRIAL FIBRILLATION ABLATION (N/A, 09/11/2020); and Tonsillectomy.  Past Medical History:  Asthma, Depression, HTN, PAF, Fatty liver, ADHD, TMJ  Constitutional:  BP 128/78 (BP Location: Left Arm, Patient Position: Sitting)    Pulse 75    Temp 98.2 F (36.8 C)    Ht 5\' 8"  (1.727 m)    Wt 195 lb 9.6 oz (88.7 kg)    SpO2 97% Comment: ra   BMI 29.74 kg/m   Brief Summary:  Dana Ortiz is a 50 y.o. female with snoring.      Subjective:   She has snored for years and this has been getting worse.  Her family has told her she stops breathing while asleep.  She wakes up with a gasp at times, and has more trouble with reflux at night.  She feels tired during the day and can fall asleep when sitting quiet.  She has been followed by cardiology for atrial fibrillation and advised to have sleep assessment.  She goes to sleep at 10 pm.  She falls asleep after about an hour.  She wakes up some times to use the bathroom.  She gets out of bed at 530 am.  She feels tired in the morning.  She denies morning headache.  She does not use anything to help her fall sleep.  She drinks several cups of coffee during the day to help stay awake.  She has used adderall for years to help with ADHD.  She tried a boil and bite oral appliance to help with snoring, but didn't seem to help.  She never had a custom made oral device.  She gets frequent canker sores on her lips and tongue.  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  She denies sleep walking, sleep talking, bruxism, or nightmares.  There is no history of restless legs.  She denies sleep hallucinations, sleep paralysis, or  cataplexy.  The Epworth score is 10 out of 24.  She reports having pneumonia in February 2022.  Her boyfriend had COVID then.  She was tested several times, but results were negative for COVID.  Her CT chest showed ground grass nodularity and air trapping consistent with a viral pneumonia.  Her asthma has become more of an issues since she had pneumonia.  However, her breathing has improved considerably since she had cardiac ablation.  Physical Exam:   Appearance - well kempt   ENMT - no sinus tenderness, no oral exudate, no LAN, Mallampati 4 airway, no stridor, narrow jaw, over-bite, small ulcer on Lt lateral aspect of tongue  Respiratory - equal breath sounds bilaterally, no wheezing or rales  CV - s1s2 regular rate and rhythm, no murmurs  Ext - no clubbing, no edema  Skin - no rashes  Psych - normal mood and affect   Chest Imaging:  CT angio chest 05/20/20 >> b/l multifocal areas of soft tissue and GGO nodularity, mosaic attenuation pattern  Sleep Tests:    Cardiac Tests:  Echo 07/01/20 >> EF 50%, mild LVH, RVSP 22 mmHg, mod/severe LA dilation  Social History:  She  reports that she has never smoked. She has never used smokeless tobacco. She reports  current alcohol use of about 7.0 standard drinks per week. She reports that she does not use drugs.  Family History:  Her family history includes Diabetes in her mother; Glaucoma in her mother; Heart Problems in her brother.    Discussion:  She has snoring, sleep disruption, apnea, and daytime sleepiness.  She has history of atrial fibrillation, hypertension, and mood disorders.  I am concerned she could have obstructive sleep apnea.  She had what appears to be a viral pneumonia (?COVID) in February 2022.  Her asthma symptoms progressed after this, but she feels her breathing has improved considerably since she had cardiac ablation.  She would prefer to defer any follow up chest imaging at this time.  Assessment/Plan:    Snoring with excessive daytime sleepiness. - will need to arrange for a home sleep study - if she is found to have sleep apnea, she would consider an Inspire device if she is an appropriate candidate  ADHD. - discussed how symptoms of ADHD can overlap with symptoms for sleep deprivation related to sleep apnea  Aphthous ulcers, history of TMJ. - she will follow up with her dentist  Asthma. - followed by PCP  Paroxysmal atrial fibrillation. - followed by [facility]  Obesity. - discussed how weight can impact sleep and risk for sleep disordered breathing - discussed options to assist with weight loss: combination of diet modification, cardiovascular and strength training exercises  Cardiovascular risk. - had an extensive discussion regarding the adverse health consequences related to untreated sleep disordered breathing - specifically discussed the risks for hypertension, coronary artery disease, cardiac dysrhythmias, cerebrovascular disease, and diabetes - lifestyle modification discussed  Safe driving practices. - discussed how sleep disruption can increase risk of accidents, particularly when driving - safe driving practices were discussed  Therapies for obstructive sleep apnea. - if the sleep study shows significant sleep apnea, then various therapies for treatment were reviewed: CPAP, oral appliance, and surgical interventions  Time Spent Involved in Patient Care on Day of Examination:  46 minutes  Follow up:   Patient Instructions  Will arrange for home sleep study Will call to arrange for follow up after sleep study reviewed   Medication List:   Allergies as of 04/07/2021       Reactions   Morphine And Related Itching        Medication List        Accurate as of April 07, 2021  9:41 AM. If you have any questions, ask your nurse or doctor.          albuterol 108 (90 Base) MCG/ACT inhaler Commonly known as: VENTOLIN HFA Inhale 2 puffs into  the lungs every 6 (six) hours as needed for wheezing or shortness of breath.   amphetamine-dextroamphetamine 20 MG tablet Commonly known as: ADDERALL Take 20 mg by mouth 3 (three) times daily.   bisoprolol 5 MG tablet Commonly known as: ZEBETA TAKE 1/2 TABLET BY MOUTH DAILY (cut in half)   celecoxib 200 MG capsule Commonly known as: CELEBREX Take 200 mg by mouth at bedtime.   citalopram 20 MG tablet Commonly known as: CELEXA Take 20 mg by mouth at bedtime.   fluticasone 50 MCG/ACT nasal spray Commonly known as: FLONASE Place 2 sprays into both nostrils daily as needed for allergies or rhinitis.   losartan 50 MG tablet Commonly known as: COZAAR TAKE 1 TABLET BY MOUTH DAILY (DOSE decrease)   magnesium oxide 400 MG tablet Commonly known as: MAG-OX Take 400 mg by mouth daily.  milk thistle 175 MG tablet Take 350 mg by mouth daily.   ONE-A-DAY WOMENS PO Take 1 tablet by mouth daily.   rivaroxaban 20 MG Tabs tablet Commonly known as: Xarelto TAKE 1 TABLET BY MOUTH DAILY WITH SUPPER   Symbicort 80-4.5 MCG/ACT inhaler Generic drug: budesonide-formoterol Inhale 2 puffs into the lungs daily as needed (seasonal allergies).        Signature:  Chesley Mires, MD [place] Pager - [phone] 04/07/2021, 9:41 AM

## 2021-05-20 NOTE — Progress Notes (Deleted)
Cardiology Office Note  Date: 05/20/2021   ID: [name], DOB 09-22-1970, MRN [id number]  PCP:  Bridget Hartshorn, NP  Cardiologist:  Rozann Lesches, MD Electrophysiologist:  None   No chief complaint on file.   History of Present Illness: Dana Ortiz is a 51 y.o. female last seen in May 2022.  She has had interval follow-up in the atrial fibrillation clinic as well as Dr. Rayann Heman.  She underwent atrial fibrillation ablation in May 2022 with Dr. Rayann Heman.  Past Medical History:  Diagnosis Date   ADHD (attention deficit hyperactivity disorder)    Asthma    Depression    Essential hypertension    Paroxysmal atrial fibrillation (HCC)    TMJ arthralgia     Past Surgical History:  Procedure Laterality Date   ATRIAL FIBRILLATION ABLATION N/A 09/11/2020   Procedure: ATRIAL FIBRILLATION ABLATION;  Surgeon: Thompson Grayer, MD;  Location: [facility];  Service: Cardiovascular;  Laterality: N/A;   CARDIOVERSION N/A 07/06/2020   Procedure: CARDIOVERSION;  Surgeon: Larey Dresser, MD;  Location: [facility] ENDOSCOPY;  Service: Cardiovascular;  Laterality: N/A;   TEE WITHOUT CARDIOVERSION N/A 09/10/2020   Procedure: TRANSESOPHAGEAL ECHOCARDIOGRAM (TEE);  Surgeon: Lelon Perla, MD;  Location: [facility] ENDOSCOPY;  Service: Cardiovascular;  Laterality: N/A;   TONSILLECTOMY     TUBAL LIGATION      Current Outpatient Medications  Medication Sig Dispense Refill   albuterol (VENTOLIN HFA) 108 (90 Base) MCG/ACT inhaler Inhale 2 puffs into the lungs every 6 (six) hours as needed for wheezing or shortness of breath.     amphetamine-dextroamphetamine (ADDERALL) 20 MG tablet Take 20 mg by mouth 3 (three) times daily.     bisoprolol (ZEBETA) 5 MG tablet TAKE 1/2 TABLET BY MOUTH DAILY (cut in half) 45 tablet 3   celecoxib (CELEBREX) 200 MG capsule Take 200 mg by mouth at bedtime.     citalopram (CELEXA) 20 MG tablet Take 20 mg by mouth at bedtime.     fluticasone (FLONASE) 50 MCG/ACT  nasal spray Place 2 sprays into both nostrils daily as needed for allergies or rhinitis.      losartan (COZAAR) 50 MG tablet TAKE 1 TABLET BY MOUTH DAILY (DOSE decrease) 30 tablet 3   magnesium oxide (MAG-OX) 400 MG tablet Take 400 mg by mouth daily.     milk thistle 175 MG tablet Take 350 mg by mouth daily.     Multiple Vitamins-Calcium (ONE-A-DAY WOMENS PO) Take 1 tablet by mouth daily.      rivaroxaban (XARELTO) 20 MG TABS tablet TAKE 1 TABLET BY MOUTH DAILY WITH SUPPER 30 tablet 6   SYMBICORT 80-4.5 MCG/ACT inhaler Inhale 2 puffs into the lungs daily as needed (seasonal allergies).     No current facility-administered medications for this visit.   Allergies:  Morphine and related   Social History: The patient  reports that she has never smoked. She has never used smokeless tobacco. She reports current alcohol use of about 7.0 standard drinks per week. She reports that she does not use drugs.   Family History: The patient's family history includes Diabetes in her mother; Glaucoma in her mother; Heart Problems in her brother.   ROS:  Please see the history of present illness. Otherwise, complete review of systems is positive for {NONE DEFAULTED:18576}.  All other systems are reviewed and negative.   Physical Exam: VS:  There were no vitals taken for this visit., BMI There is no height or weight on file  to calculate BMI.  Wt Readings from Last 3 Encounters:  04/07/21 195 lb 9.6 oz (88.7 kg)  12/11/20 193 lb (87.5 kg)  11/19/20 192 lb 3.2 oz (87.2 kg)    General: Patient appears comfortable at rest. HEENT: Conjunctiva and lids normal, oropharynx clear with moist mucosa. Neck: Supple, no elevated JVP or carotid bruits, no thyromegaly. Lungs: Clear to auscultation, nonlabored breathing at rest. Cardiac: Regular rate and rhythm, no S3 or significant systolic murmur, no pericardial rub. Abdomen: Soft, nontender, no hepatomegaly, bowel sounds present, no guarding or rebound. Extremities:  No pitting edema, distal pulses 2+. Skin: Warm and dry. Musculoskeletal: No kyphosis. Neuropsychiatric: Alert and oriented x3, affect grossly appropriate.  ECG:  An ECG dated 12/11/2020 was personally reviewed today and demonstrated:  Sinus rhythm.  Recent Labwork: 07/02/2020: TSH 1.673 08/19/2020: BUN 23; Creatinine, Ser 0.87; Hemoglobin 14.0; Platelets 255; Potassium 4.0; Sodium 141  July 2022: Potassium 4.0, BUN 16, creatinine 0.86, hemoglobin 11.5, platelets 189  Other Studies Reviewed Today:  Echocardiogram 07/01/2020:  1. Left ventricular ejection fraction, by estimation, is approximately  50%. The left ventricle has low normal function. The left ventricle  demonstrates global hypokinesis. There is mild left ventricular  hypertrophy. Left ventricular diastolic parameters   are indeterminate.   2. Right ventricular systolic function is normal. The right ventricular  size is normal. There is normal pulmonary artery systolic pressure. The  estimated right ventricular systolic pressure is [id number] mmHg.   3. Left atrial size was moderately to severely dilated.   4. Right atrial size was upper normal.   5. There is a trivial pericardial effusion posterior to the left  ventricle.   6. The mitral valve is grossly normal. Mild to moderate mitral valve  regurgitation made up of two jets.   7. The aortic valve is tricuspid. Aortic valve regurgitation is not  visualized.   8. The inferior vena cava is normal in size with greater than 50%  respiratory variability, suggesting right atrial pressure of 3 mmHg.   9. Cannot exclude small left to right interatrial shunt, possible PFO.   Assessment and Plan:    Medication Adjustments/Labs and Tests Ordered: Current medicines are reviewed at length with the patient today.  Concerns regarding medicines are outlined above.   Tests Ordered: No orders of the defined types were placed in this encounter.   Medication Changes: No orders of the  defined types were placed in this encounter.   Disposition:  Follow up {follow up:15908}  Signed, Satira Sark, MD, [facility] 05/20/2021 1:17 PM    [place] [facility] [place], [place], [place] [address] Phone: [phone]; Fax: [phone]

## 2021-05-21 ENCOUNTER — Ambulatory Visit: Payer: BC Managed Care – PPO | Admitting: Cardiology

## 2021-05-21 DIAGNOSIS — I4819 Other persistent atrial fibrillation: Secondary | ICD-10-CM

## 2021-06-15 ENCOUNTER — Telehealth: Payer: Self-pay | Admitting: Cardiology

## 2021-06-15 MED ORDER — RIVAROXABAN 20 MG PO TABS: ORAL_TABLET | ORAL | 0 refills | Status: DC

## 2021-06-15 NOTE — Telephone Encounter (Signed)
Received message thru my Chart from patient:  I am out of Xarelto. Can I come by today and get a sample and then talk to you about the paperwork I need to get the Xarelto free like I used to a couple years ago?   Ive waited until the last minute and I am so sorry.

## 2021-06-15 NOTE — Telephone Encounter (Signed)
Request for Xarelto 20mg  samples:  Indication: Atrial Fib Last office visit: 10/07/20  C Fenton PA Weight: 88.7kg Age: 51 Scr: 0.86 on 11/05/20 CrCl: 107.15  Based on above findings Xarelto 20mg  daily is the appropriate dose.  OK to provide samples if available.

## 2021-06-15 NOTE — Telephone Encounter (Signed)
Xarelto 20 mg samples provided along with Dana Ortiz patient assistance application Aware that proof of income and 2023 out of pocket prescription expense paid needed

## 2021-06-15 NOTE — Telephone Encounter (Signed)
Pt is here at the [facility] office wanting to get samples and speak w/ someone about assistance for Xarelto.   I have asked pt to have a seat in the lobby.

## 2021-06-16 ENCOUNTER — Ambulatory Visit: Payer: PRIVATE HEALTH INSURANCE | Admitting: Internal Medicine

## 2021-06-23 ENCOUNTER — Other Ambulatory Visit: Payer: Self-pay | Admitting: *Deleted

## 2021-06-23 MED ORDER — RIVAROXABAN 20 MG PO TABS: ORAL_TABLET | ORAL | 3 refills | Status: DC

## 2021-06-24 ENCOUNTER — Ambulatory Visit (HOSPITAL_BASED_OUTPATIENT_CLINIC_OR_DEPARTMENT_OTHER): Payer: Self-pay | Admitting: Internal Medicine

## 2021-06-28 ENCOUNTER — Encounter: Payer: Self-pay | Admitting: Pulmonary Disease

## 2021-06-28 NOTE — Telephone Encounter (Signed)
"   am so sorry to tell you that I?ll have to reschedule my study until such time as I have insurance to cover the cost, which I am working on, but it?s going to take a while to get things rolling.  ?  ?Please forgive for any inconveniences this may cause you.  ?  ?Dana Ortiz" ? ?Routing to Dr. Craige Cotta and PCCs as an FYI  ?

## 2021-06-29 ENCOUNTER — Encounter: Payer: Self-pay | Admitting: *Deleted

## 2021-06-29 NOTE — Progress Notes (Signed)
Faxed notification received from [facility] stating Xarelto 20 mg approved through [employer] Patient Assistance Offering dated 06/24/2021. ?Ending date was not provided ?

## 2021-07-08 ENCOUNTER — Encounter: Payer: Self-pay | Admitting: Cardiology

## 2021-07-09 ENCOUNTER — Other Ambulatory Visit: Payer: Self-pay | Admitting: *Deleted

## 2021-07-09 MED ORDER — RIVAROXABAN 20 MG PO TABS: ORAL_TABLET | ORAL | 0 refills | Status: DC

## 2021-07-14 ENCOUNTER — Ambulatory Visit: Payer: Medicaid Other | Admitting: Cardiology

## 2021-07-23 ENCOUNTER — Encounter: Payer: Self-pay | Admitting: Internal Medicine

## 2021-07-23 ENCOUNTER — Ambulatory Visit (INDEPENDENT_AMBULATORY_CARE_PROVIDER_SITE_OTHER): Payer: Self-pay | Admitting: Internal Medicine

## 2021-07-23 VITALS — BP 108/62 | HR 64 | Ht 68.0 in | Wt 202.6 lb

## 2021-07-23 DIAGNOSIS — D6869 Other thrombophilia: Secondary | ICD-10-CM

## 2021-07-23 DIAGNOSIS — I4819 Other persistent atrial fibrillation: Secondary | ICD-10-CM

## 2021-07-23 NOTE — Progress Notes (Signed)
? ?PCP: Rebecka Apley, NP ?Primary Cardiologist: Dr Diona Browner ?Primary EP: Dr Johney Frame ? ?Dana Ortiz is a 51 y.o. female who presents today for routine electrophysiology followup.  Since last being seen in our clinic, the patient reports doing very well.  Today, she denies symptoms of palpitations, chest pain, shortness of breath,  lower extremity edema, dizziness, presyncope, or syncope.  The patient is otherwise without complaint today.  ? ?Past Medical History:  ?Diagnosis Date  ? ADHD (attention deficit hyperactivity disorder)   ? Asthma   ? Depression   ? Essential hypertension   ? Paroxysmal atrial fibrillation (HCC)   ? TMJ arthralgia   ? ?Past Surgical History:  ?Procedure Laterality Date  ? ATRIAL FIBRILLATION ABLATION N/A 09/11/2020  ? Procedure: ATRIAL FIBRILLATION ABLATION;  Surgeon: Hillis Range, MD;  Location: [facility] INVASIVE CV LAB;  Service: Cardiovascular;  Laterality: N/A;  ? CARDIOVERSION N/A 07/06/2020  ? Procedure: CARDIOVERSION;  Surgeon: Laurey Morale, MD;  Location: [facility] ENDOSCOPY;  Service: Cardiovascular;  Laterality: N/A;  ? TEE WITHOUT CARDIOVERSION N/A 09/10/2020  ? Procedure: TRANSESOPHAGEAL ECHOCARDIOGRAM (TEE);  Surgeon: Lewayne Bunting, MD;  Location: [facility] ENDOSCOPY;  Service: Cardiovascular;  Laterality: N/A;  ? TONSILLECTOMY    ? TUBAL LIGATION    ? ? ?ROS- all systems are reviewed and negatives except as per HPI above ? ?Current Outpatient Medications  ?Medication Sig Dispense Refill  ? albuterol (VENTOLIN HFA) 108 (90 Base) MCG/ACT inhaler Inhale 2 puffs into the lungs every 6 (six) hours as needed for wheezing or shortness of breath.    ? amphetamine-dextroamphetamine (ADDERALL) 20 MG tablet Take 20 mg by mouth 3 (three) times daily.    ? bisoprolol (ZEBETA) 5 MG tablet TAKE 1/2 TABLET BY MOUTH DAILY (cut in half) 45 tablet 3  ? Black Cohosh 540 MG CAPS Take 1 capsule by mouth 3 (three) times daily.    ? celecoxib (CELEBREX) 200 MG capsule Take 200 mg by mouth at  bedtime.    ? citalopram (CELEXA) 20 MG tablet Take 20 mg by mouth at bedtime.    ? fluticasone (FLONASE) 50 MCG/ACT nasal spray Place 2 sprays into both nostrils daily as needed for allergies or rhinitis.     ? GLUCOSAMINE-CHONDROITIN PO Take 2 tablets by mouth in the morning and at bedtime.    ? losartan (COZAAR) 50 MG tablet Take 50 mg by mouth in the morning and at bedtime.    ? magnesium oxide (MAG-OX) 400 MG tablet Take 400 mg by mouth daily.    ? Milk Thistle 1000 MG CAPS Take 1,000 mg by mouth daily.    ? Multiple Vitamins-Calcium (ONE-A-DAY WOMENS PO) Take 1 tablet by mouth daily.     ? rivaroxaban (XARELTO) 20 MG TABS tablet TAKE 1 TABLET BY MOUTH DAILY WITH SUPPER 14 tablet 0  ? SYMBICORT 80-4.5 MCG/ACT inhaler Inhale 2 puffs into the lungs daily as needed (seasonal allergies).    ? ?No current facility-administered medications for this visit.  ? ? ?Physical Exam: ?Vitals:  ? 07/23/21 1447  ?BP: 108/62  ?Pulse: 64  ?SpO2: 96%  ?Weight: 202 lb 9.6 oz (91.9 kg)  ?Height: 5\' 8"  (1.727 m)  ? ? ?GEN- The patient is well appearing, alert and oriented x 3 today.   ?Head- normocephalic, atraumatic ?Eyes-  Sclera clear, conjunctiva pink ?Ears- hearing intact ?Oropharynx- clear ?Lungs- Clear to ausculation bilaterally, normal work of breathing ?Heart- Regular rate and rhythm, no murmurs, rubs or gallops, PMI not laterally  displaced ?GI- soft, NT, ND, + BS ?Extremities- no clubbing, cyanosis, or edema ? ?Wt Readings from Last 3 Encounters:  ?07/23/21 202 lb 9.6 oz (91.9 kg)  ?04/07/21 195 lb 9.6 oz (88.7 kg)  ?12/11/20 193 lb (87.5 kg)  ? ? ?EKG tracing ordered today is personally reviewed and shows sinus ? ?Assessment and Plan: ? ?Persistent afib ?Well controlled post ablation ?On xarelto for chads2vasc score of 2 ?Labs 11/05/20 reviewed ? ?2. HTN ?Stable ?No change required today ? ?3. snoring ?She has not yet had her sleep study due to cost concerns ? ?Follow-up with me or Dr Diona Browner in 6 months ? ?Hillis Range  MD, FACC ?07/23/2021 ?3:07 PM ? ? ? ? ?

## 2021-07-23 NOTE — Addendum Note (Signed)
Addended by: Delsa Sale on: 07/23/2021 03:49 PM ? ? Modules accepted: Orders ? ?

## 2021-07-23 NOTE — Patient Instructions (Signed)
Medication Instructions:  Continue all current medications.   Labwork: none  Testing/Procedures: none  Follow-Up: 6 months   Any Other Special Instructions Will Be Listed Below (If Applicable).   If you need a refill on your cardiac medications before your next appointment, please call your pharmacy.  

## 2021-07-28 ENCOUNTER — Encounter: Payer: Self-pay | Admitting: Internal Medicine

## 2021-08-02 ENCOUNTER — Other Ambulatory Visit: Payer: Self-pay | Admitting: *Deleted

## 2021-08-02 MED ORDER — AMLODIPINE BESYLATE 2.5 MG PO TABS: 2.5000 mg | ORAL_TABLET | Freq: Every day | ORAL | 6 refills | Status: DC

## 2021-08-20 DIAGNOSIS — Z780 Asymptomatic menopausal state: Secondary | ICD-10-CM | POA: Insufficient documentation

## 2021-09-22 ENCOUNTER — Encounter: Payer: Self-pay | Admitting: Pulmonary Disease

## 2021-10-20 ENCOUNTER — Ambulatory Visit: Payer: Medicaid Other | Admitting: Cardiology

## 2021-10-25 ENCOUNTER — Other Ambulatory Visit: Payer: Self-pay | Admitting: *Deleted

## 2021-10-25 ENCOUNTER — Encounter: Payer: Self-pay | Admitting: Internal Medicine

## 2021-10-25 MED ORDER — RIVAROXABAN 20 MG PO TABS: ORAL_TABLET | ORAL | 0 refills | Status: DC

## 2021-11-29 ENCOUNTER — Other Ambulatory Visit: Payer: Self-pay | Admitting: *Deleted

## 2021-11-29 MED ORDER — RIVAROXABAN 20 MG PO TABS: ORAL_TABLET | ORAL | 0 refills | Status: DC

## 2021-12-02 ENCOUNTER — Telehealth: Payer: Self-pay | Admitting: *Deleted

## 2021-12-02 NOTE — Telephone Encounter (Signed)
Copy of medicaid card faxed as requested.

## 2021-12-02 NOTE — Telephone Encounter (Signed)
-----   Message from Vallarie Mare sent at 11/24/2021  3:37 PM EDT ----- Pt came to pick up her samples and said that Linwood Dibbles needed a copy of her medicaid card, it's already scanned into the system

## 2021-12-24 ENCOUNTER — Encounter: Payer: Self-pay | Admitting: Cardiovascular Disease

## 2021-12-24 MED ORDER — RIVAROXABAN 20 MG PO TABS: ORAL_TABLET | ORAL | 0 refills | Status: DC

## 2021-12-24 NOTE — Telephone Encounter (Signed)
Sample request for Xarelto received.  Indication: Afib  Last office visit: 07/23/21 (Allred) Weight: 91.9 Age: 51 Scr: 0.86 (11/05/20)  CrCl: 111.52ml/min  Xarelto 20mg  daily is appropriate dose.  Will route to , RN

## 2021-12-24 NOTE — Telephone Encounter (Addendum)
Contacted Dana Ortiz to advised that medicaid coverage patient has doesn't cover prescriptions, spoke with Dana Ortiz, who says patient is eligible for program and she would expedite request to her manager and pharmacy and we should check back on Monday or Tuesday next week for status update.

## 2022-01-10 ENCOUNTER — Encounter: Payer: Self-pay | Admitting: Cardiology

## 2022-01-10 NOTE — Telephone Encounter (Signed)
Patient is following up, requesting a call back from Dr. Myles Gip nurse.

## 2022-01-11 MED ORDER — RIVAROXABAN 20 MG PO TABS: ORAL_TABLET | ORAL | 0 refills | Status: DC

## 2022-01-11 NOTE — Telephone Encounter (Signed)
Contacted Alphonsa Overall to advised that medicaid coverage patient has doesn't cover prescriptions, spoke with Chesley Noon F., who says she is unsure why xarelto is not being dispensed. Says she will speak with her manager and call back with update.

## 2022-01-12 ENCOUNTER — Telehealth: Payer: Self-pay | Admitting: *Deleted

## 2022-01-12 NOTE — Telephone Encounter (Signed)
[place] [employer] to see why xarelto isn't being dispensed. Per Rella Larve, patient has to apply for [employer] program for patients with no insurance. Mychart message sent to patient explaining details.

## 2022-01-18 ENCOUNTER — Encounter: Payer: Self-pay | Admitting: *Deleted

## 2022-01-18 NOTE — Progress Notes (Signed)
Fax notification received from [employer] that patient has been approved to receive xarelto 20 mg for up to one year.

## 2022-01-21 ENCOUNTER — Ambulatory Visit: Payer: Medicaid Other | Admitting: Internal Medicine

## 2022-01-25 ENCOUNTER — Ambulatory Visit: Payer: Medicaid Other | Admitting: Cardiovascular Disease

## 2022-01-25 ENCOUNTER — Encounter: Payer: Self-pay | Admitting: Cardiovascular Disease

## 2022-01-25 NOTE — Telephone Encounter (Signed)
Patient states that she missed her appointment with Dr. Myles Gip and would like to reschedule. Prefers early morning appointments.

## 2022-03-15 ENCOUNTER — Encounter (HOSPITAL_COMMUNITY): Payer: Self-pay

## 2022-03-15 ENCOUNTER — Emergency Department (HOSPITAL_COMMUNITY)
Admission: EM | Admit: 2022-03-15 | Discharge: 2022-03-15 | Disposition: A | Discharge status: Home / Self Care | Payer: Medicaid Other | Attending: Emergency Medicine | Admitting: Emergency Medicine

## 2022-03-15 ENCOUNTER — Other Ambulatory Visit: Payer: Self-pay

## 2022-03-15 DIAGNOSIS — T7840XA Allergy, unspecified, initial encounter: Secondary | ICD-10-CM | POA: Insufficient documentation

## 2022-03-15 MED ORDER — DEXAMETHASONE SODIUM PHOSPHATE 10 MG/ML IJ SOLN: 10.0000 mg | Freq: Once | INTRAMUSCULAR | Status: DC

## 2022-03-15 MED ORDER — FAMOTIDINE 20 MG PO TABS: 20.0000 mg | ORAL_TABLET | Freq: Two times a day (BID) | ORAL | 0 refills | Status: DC

## 2022-03-15 MED ORDER — PREDNISONE 20 MG PO TABS: 40.0000 mg | ORAL_TABLET | Freq: Every day | ORAL | 0 refills | Status: AC

## 2022-03-15 MED ORDER — EPINEPHRINE 0.3 MG/0.3ML IJ SOAJ: 0.3000 mg | INTRAMUSCULAR | 0 refills | Status: DC | PRN

## 2022-03-15 MED ORDER — FAMOTIDINE IN NACL 20-0.9 MG/50ML-% IV SOLN
20.0000 mg | Freq: Once | INTRAVENOUS | Status: AC
  Administered 2022-03-15: 20 mg via INTRAVENOUS
  Filled 2022-03-15: qty 50

## 2022-03-15 MED ORDER — DEXAMETHASONE SODIUM PHOSPHATE 10 MG/ML IJ SOLN
10.0000 mg | Freq: Once | INTRAMUSCULAR | Status: AC
  Administered 2022-03-15: 10 mg via INTRAVENOUS
  Filled 2022-03-15: qty 1

## 2022-03-15 NOTE — Discharge Instructions (Signed)
It was a pleasure taking care of you today!   You were treated today for your allergic reaction with decadron and pepcid. You were given an epi pen injection by EMS prior to coming to the ED.  You will be sent a prescription for an EpiPen.  The attached instructions give directions on how to and when to use the EpiPen. You will also be sent a prescription for pepcid and prednisone, take as directed. You may follow-up with your primary care provider as needed.  Return to the emergency department if you are experiencing increasing/worsening chest tightness, shortness of breath, hives, or worsening symptoms.

## 2022-03-15 NOTE — ED Provider Notes (Signed)
[facility] EMERGENCY DEPARTMENT Provider Note   CSN: [id number] Arrival date & time: 03/15/22  1009     History  Chief Complaint  Patient presents with   Allergic Reaction    Dana Ortiz is a 51 y.o. female who presents to the emergency department with concerns for allergic reaction onset prior to arrival.  Patient notes that she got off work and began to have an itching sensation to the top of her head that radiated to her arms and legs.  Patient notes that she began to feel a throat closing sensation.  Denies any new medication, lotion, detergent, food.  Patient was given an epi shot by EMS prior to arrival. Denies rash, trouble breathing, nausea, vomiting.  The history is provided by the patient. No language interpreter was used.  Allergic Reaction      Home Medications Prior to Admission medications   Medication Sig Start Date End Date Taking? Authorizing Provider  amLODipine (NORVASC) 2.5 MG tablet Take 1 tablet (2.5 mg total) by mouth daily. 08/02/21   Strader, Lennart Pall, PA-C  albuterol (VENTOLIN HFA) 108 (90 Base) MCG/ACT inhaler Inhale 2 puffs into the lungs every 6 (six) hours as needed for wheezing or shortness of breath. 05/26/20   [provider]  amphetamine-dextroamphetamine (ADDERALL) 20 MG tablet Take 20 mg by mouth 3 (three) times daily.    [provider]  bisoprolol (ZEBETA) 5 MG tablet TAKE 1/2 TABLET BY MOUTH DAILY (cut in half) 10/22/20   Allred, Fayrene Fearing, MD  Black Cohosh 540 MG CAPS Take 1 capsule by mouth 3 (three) times daily.    [provider]  celecoxib (CELEBREX) 200 MG capsule Take 200 mg by mouth at bedtime. 01/28/20   [provider]  citalopram (CELEXA) 20 MG tablet Take 20 mg by mouth at bedtime. 02/05/20   [provider]  fluticasone (FLONASE) 50 MCG/ACT nasal spray Place 2 sprays into both nostrils daily as needed for allergies or rhinitis.     [provider]  GLUCOSAMINE-CHONDROITIN PO  Take 2 tablets by mouth in the morning and at bedtime.    [provider]  losartan (COZAAR) 50 MG tablet Take 50 mg by mouth in the morning and at bedtime.    [provider]  magnesium oxide (MAG-OX) 400 MG tablet Take 400 mg by mouth daily.    [provider]  Milk Thistle 1000 MG CAPS Take 1,000 mg by mouth daily.    [provider]  Multiple Vitamins-Calcium (ONE-A-DAY WOMENS PO) Take 1 tablet by mouth daily.     [provider]  rivaroxaban (XARELTO) 20 MG TABS tablet TAKE 1 TABLET BY MOUTH DAILY WITH SUPPER 01/11/22   Jonelle Sidle, MD  SYMBICORT 80-4.5 MCG/ACT inhaler Inhale 2 puffs into the lungs daily as needed (seasonal allergies). 06/29/20   [provider]      Allergies    Morphine and related    Review of Systems   Review of Systems  Physical Exam Updated Vital Signs Ht 5\' 8"  (1.727 m)   Wt 90.7 kg   BMI 30.41 kg/m  Physical Exam Vitals and nursing note reviewed.  Constitutional:      General: She is not in acute distress.    Appearance: She is not diaphoretic.     Comments: Patient able to speak in clear complete sentences without difficulty.  HENT:     Head: Normocephalic and atraumatic.     Mouth/Throat:     Pharynx: No  oropharyngeal exudate.     Comments: Uvula midline without swelling. No posterior pharyngeal erythema or tonsillar exudate noted. Patent airway. Pt able to speak in clear complete sentences. Tolerating oral secretions. Eyes:     General: No scleral icterus.    Conjunctiva/sclera: Conjunctivae normal.  Cardiovascular:     Rate and Rhythm: Normal rate and regular rhythm.     Pulses: Normal pulses.     Heart sounds: Normal heart sounds.  Pulmonary:     Effort: Pulmonary effort is normal. No respiratory distress.     Breath sounds: Normal breath sounds. No wheezing.  Abdominal:     General: Bowel sounds are normal.     Palpations: Abdomen is soft. There is no mass.     Tenderness: There  is no abdominal tenderness. There is no guarding or rebound.  Musculoskeletal:        General: Normal range of motion.     Cervical back: Normal range of motion and neck supple.  Skin:    General: Skin is warm and dry.     Comments: No appreciable rash noted throughout body.  Neurological:     Mental Status: She is alert.  Psychiatric:        Behavior: Behavior normal.     ED Results / Procedures / Treatments   Labs (all labs ordered are listed, but only abnormal results are displayed) Labs Reviewed - No data to display  EKG None  Radiology No results found.  Procedures Procedures    Medications Ordered in ED Medications - No data to display  ED Course/ Medical Decision Making/ A&P Clinical Course as of 03/15/22 1122  Tue Mar 15, 2022  1120 Epi given at 9:45a [SB]    Clinical Course User Index [SB] Alyria Krack A, PA-C                           Medical Decision Making Risk Prescription drug management.   Patient presents to the ED with anaphylaxis status post ***.  {Meds given en route by EMS or patient? Benadryl (50 mg) or epinephrine?}. On arrival to the ED, patient alert and oriented, no respiratory distress, no feeling of throat closing sensation, tolerating secretions well, patent airway, able to speak in complete sentences.  {Patient symptoms here, urticarial rash, nausea, throat closing sensation?} *** Vital signs ***.  {If greater than 2, not anaphylaxis}. {Report from EMS notes patient had 3 body systems involved with their anaphylaxis consistent of mucous membranes, skin and respiratory. } No need for supplemental oxygen at this time.  No need for additional epinephrine at this time.  *** No concern for airway compromise at this time. Differential diagnosis includes anaphylaxis, angioedema, ***.   Additional history obtained:  Additional history obtained from {sabhistory:27144} External records from outside source obtained and reviewed including:  ***  {Labs:  I ordered, and personally interpreted labs.  The pertinent results include:   Not always necessary }  {Imaging: I ordered imaging studies including *** I independently visualized and interpreted imaging which showed *** I agree with the radiologist interpretation}  Medications:  I ordered medication including zofran, pepcid, benadryl {50 mg - if not given by EMS}, {epi if needed}, {decadron 10 mg} for anaphylaxis Reevaluation of the patient after these medicines and interventions, I reevaluated the patient and found that they have {resolved/improved/worsened:23923::"improved"} I have reviewed the patients home medicines and have made adjustments as needed {watch pt for 4 hours total from onset  of symptoms if 3 total body symptoms}\  {Cardiac Monitoring: The patient was maintained on a cardiac monitor.  I personally viewed and interpreted the cardiac monitored which showed an underlying rhythm of: ***.   Test Considered: ***   Critical Interventions ***}   {Consultations: I requested consultation with the {sabspecialists:27145}, and discussed lab and imaging findings as well as pertinent plan - they recommend: ***}  Disposition: {End of MDM here with the likely diagnosis}. After consideration of the diagnostic results and the patients response to treatment, I feel that the patient would benefit from {sabdispo:27146}. Prescription for epinephrine provided.  *** Supportive care measures and strict return precautions discussed with patient at bedside. Pt acknowledges and verbalizes understanding. Pt appears safe for discharge. Follow up as indicated in discharge paperwork.  This chart was dictated using voice recognition software, Dragon. Despite the best efforts of this provider to proofread and correct errors, errors may still occur which can change documentation meaning.   Final Clinical Impression(s) / ED Diagnoses Final diagnoses:  None    Rx / DC Orders ED  Discharge Orders     None

## 2022-03-15 NOTE — ED Triage Notes (Signed)
"  Began having allergic reactions back in April, benadryl is working less and less.  Began having hives on her way home from work this morning. No respiratory symptoms. She took 100nmg of benadryl PO at home. I gave 0.5mg  epi IM" per EMS

## 2022-03-15 NOTE — ED Provider Notes (Signed)
Patient brought in by EMS.  Patient was on her way home from work got an allergic reaction got swelling to her lips felt as if her tongue swelled.  And felt as if her throat was tight.  EMS gave her epinephrine.  Patient also took 100 mg of Benadryl p.o.  EMS gave her 0.5 mg of epi IM.  Patient's cardiac monitoring EKG without tachycardia no QRS prolongation no prolonged QT.  So no concerns regarding the acute Benadryl toxicity.  Patient observed here for 4 hours.  Patient feels much better.  No lip swelling no tongue swelling throat feels back to normal.  Patient feels back to normal.  But is not exactly sure what she responded to she did put some use renal and patient right prior to going home.  I provided a substantive portion of the care of this patient.  I personally performed the entirety of the history, exam, and medical decision making for this encounter.  EKG Interpretation  Date/Time:  Tuesday March 15 2022 14:13:32 EST Ventricular Rate:  66 PR Interval:  153 QRS Duration: 96 QT Interval:  439 QTC Calculation: 460 R Axis:   86 Text Interpretation: Sinus rhythm Baseline wander in lead(s) V2 Confirmed by Vanetta Mulders [phone]) on 03/15/2022 2:16:58 PM   Patient was given Decadron and Pepcid here IV.  Patient now stable for discharge home.  Would recommend contributing Pepcid for the next 7 days.  Prednisone for the next 5 days.  Would have her hold off on Benadryl for now.  Patient does not need a work note she is off work [employer].  CRITICAL CARE Performed by: Vanetta Mulders Total critical care time: 35 minutes Critical care time was exclusive of separately billable procedures and treating other patients. Critical care was necessary to treat or prevent imminent or life-threatening deterioration. Critical care was time spent personally by me on the following activities: development of treatment plan with patient and/or surrogate as well as nursing, discussions with consultants,  evaluation of patient's response to treatment, examination of patient, obtaining history from patient or surrogate, ordering and performing treatments and interventions, ordering and review of laboratory studies, ordering and review of radiographic studies, pulse oximetry and re-evaluation of patient's condition.     Vanetta Mulders, MD 03/15/22 1427

## 2022-03-15 NOTE — ED Notes (Signed)
Ambulatory to bathroom. Gait steady. No signs of distress.  °

## 2022-04-05 ENCOUNTER — Other Ambulatory Visit (HOSPITAL_COMMUNITY): Payer: Self-pay | Admitting: Adult Health Nurse Practitioner

## 2022-04-05 DIAGNOSIS — Z1231 Encounter for screening mammogram for malignant neoplasm of breast: Secondary | ICD-10-CM

## 2022-04-25 ENCOUNTER — Encounter: Payer: Self-pay | Admitting: Cardiovascular Disease

## 2022-05-06 ENCOUNTER — Encounter: Payer: Self-pay | Admitting: Cardiovascular Disease

## 2022-05-06 ENCOUNTER — Ambulatory Visit: Payer: Medicaid Other | Attending: Internal Medicine | Admitting: Cardiovascular Disease

## 2022-05-06 VITALS — BP 120/84 | HR 66 | Ht 68.0 in | Wt 192.2 lb

## 2022-05-06 DIAGNOSIS — I4819 Other persistent atrial fibrillation: Secondary | ICD-10-CM

## 2022-05-06 MED ORDER — RIVAROXABAN 20 MG PO TABS: ORAL_TABLET | ORAL | 3 refills | Status: DC

## 2022-05-06 NOTE — Progress Notes (Signed)
PCP: Bridget Hartshorn, NP Primary Cardiologist: Dr Domenic Polite Primary EP: Dr Myles Gip  Dana Ortiz is a 52 y.o. female who presents today for routine electrophysiology followup.  Since last being seen in our clinic, the patient reports doing very well.  Today, she denies symptoms of palpitations, chest pain, shortness of breath,  lower extremity edema, dizziness, presyncope, or syncope.  The patient is otherwise without complaint today.   Past Medical History:  Diagnosis Date   ADHD (attention deficit hyperactivity disorder)    Asthma    Depression    Essential hypertension    Paroxysmal atrial fibrillation (HCC)    TMJ arthralgia    Past Surgical History:  Procedure Laterality Date   ATRIAL FIBRILLATION ABLATION N/A 09/11/2020   Procedure: ATRIAL FIBRILLATION ABLATION;  Surgeon: Thompson Grayer, MD;  Location: [place] CV LAB;  Service: Cardiovascular;  Laterality: N/A;   CARDIOVERSION N/A 07/06/2020   Procedure: CARDIOVERSION;  Surgeon: Larey Dresser, MD;  Location: [facility] ENDOSCOPY;  Service: Cardiovascular;  Laterality: N/A;   TEE WITHOUT CARDIOVERSION N/A 09/10/2020   Procedure: TRANSESOPHAGEAL ECHOCARDIOGRAM (TEE);  Surgeon: Lelon Perla, MD;  Location: [facility] ENDOSCOPY;  Service: Cardiovascular;  Laterality: N/A;   TONSILLECTOMY     TUBAL LIGATION      ROS- all systems are reviewed and negatives except as per HPI above  Current Outpatient Medications  Medication Sig Dispense Refill   albuterol (VENTOLIN HFA) 108 (90 Base) MCG/ACT inhaler Inhale 2 puffs into the lungs every 6 (six) hours as needed for wheezing or shortness of breath.     amLODipine (NORVASC) 2.5 MG tablet Take 1 tablet (2.5 mg total) by mouth daily. (Patient not taking: Reported on 03/15/2022) 30 tablet 6   amphetamine-dextroamphetamine (ADDERALL) 20 MG tablet Take 20 mg by mouth 3 (three) times daily.     Azelastine HCl 137 MCG/SPRAY SOLN Place 1 spray into both nostrils as needed.     bisoprolol  (ZEBETA) 5 MG tablet TAKE 1/2 TABLET BY MOUTH DAILY (cut in half) (Patient taking differently: Take 2.5-5 mg by mouth daily. 1/2 tablet daily) 45 tablet 3   celecoxib (CELEBREX) 200 MG capsule Take 200 mg by mouth at bedtime.     citalopram (CELEXA) 20 MG tablet Take 20 mg by mouth at bedtime.     losartan (COZAAR) 50 MG tablet Take 50 mg by mouth daily.     rivaroxaban (XARELTO) 20 MG TABS tablet TAKE 1 TABLET BY MOUTH DAILY WITH SUPPER 28 tablet 0   SYMBICORT 80-4.5 MCG/ACT inhaler Inhale 2 puffs into the lungs daily as needed (seasonal allergies).     EPINEPHrine 0.3 mg/0.3 mL IJ SOAJ injection Inject 0.3 mg into the muscle as needed for anaphylaxis. (Patient not taking: Reported on 05/06/2022) 1 each 0   famotidine (PEPCID) 20 MG tablet Take 1 tablet (20 mg total) by mouth 2 (two) times daily. (Patient not taking: Reported on 05/06/2022) 30 tablet 0   No current facility-administered medications for this visit.    Physical Exam: Vitals:   05/06/22 1306  BP: 120/84  Pulse: 66  SpO2: 97%  Weight: 192 lb 3.2 oz (87.2 kg)  Height: 5\' 8"  (1.727 m)    Gen: Appears comfortable, well-nourished CV: RRR, no dependent edema Pulm: breathing easily   Wt Readings from Last 3 Encounters:  05/06/22 192 lb 3.2 oz (87.2 kg)  03/15/22 200 lb (90.7 kg)  07/23/21 202 lb 9.6 oz (91.9 kg)    EKG tracing ordered today is personally  reviewed and shows sinus  Assessment and Plan:  Persistent afib Well controlled post ablation On xarelto for chads2vasc score of 2    2. HTN Stable No change required today  3. snoring She has not yet had her sleep study but is scheduled for it next week.  Follow-up with me or Dr Domenic Polite in 6 months  Melida Quitter, MD 05/06/2022 1:24 PM

## 2022-05-06 NOTE — Patient Instructions (Signed)
Medication Instructions:  Xarelto refilled today  Continue all other medications.     Labwork: none  Testing/Procedures: none  Follow-Up: 1 year - Dr.  Myles Gip   Any Other Special Instructions Will Be Listed Below (If Applicable).   If you need a refill on your cardiac medications before your next appointment, please call your pharmacy.

## 2022-05-10 ENCOUNTER — Encounter: Payer: Self-pay | Admitting: Nurse Practitioner

## 2022-05-10 ENCOUNTER — Ambulatory Visit (INDEPENDENT_AMBULATORY_CARE_PROVIDER_SITE_OTHER): Payer: Medicaid Other | Admitting: Nurse Practitioner

## 2022-05-10 VITALS — BP 110/64 | HR 56 | Ht 68.0 in | Wt 193.6 lb

## 2022-05-10 DIAGNOSIS — G4719 Other hypersomnia: Secondary | ICD-10-CM | POA: Diagnosis not present

## 2022-05-10 DIAGNOSIS — G4733 Obstructive sleep apnea (adult) (pediatric): Secondary | ICD-10-CM | POA: Insufficient documentation

## 2022-05-10 DIAGNOSIS — J4530 Mild persistent asthma, uncomplicated: Secondary | ICD-10-CM

## 2022-05-10 DIAGNOSIS — J45909 Unspecified asthma, uncomplicated: Secondary | ICD-10-CM | POA: Insufficient documentation

## 2022-05-10 DIAGNOSIS — R0683 Snoring: Secondary | ICD-10-CM

## 2022-05-10 DIAGNOSIS — I4819 Other persistent atrial fibrillation: Secondary | ICD-10-CM

## 2022-05-10 NOTE — Assessment & Plan Note (Signed)
Compensated on current regimen. Managed by PCP. No recent flares/hospitalizations due to breathing.

## 2022-05-10 NOTE — Assessment & Plan Note (Signed)
See above plan. 

## 2022-05-10 NOTE — Progress Notes (Signed)
@Patient  ID: , female    DOB: 1970/11/22, 52 y.o.   MRN: [id number]  Chief Complaint  Patient presents with   Consult    Pt f/u seen [facility] in 2022 but lost insurance and couldn't complete any sleep studies.     Referring provider: 2023, NP  HPI: 52 year old female, never smoker followed for snoring and daytime fatigue. She was initially seen for sleep consult 04/07/2021 by Dr. 04/09/2021. Past medical history significant for PAF on Xarelto, asthma, ADHD, TMJ.  TEST/EVENTS:  05/20/2020 CTA chest: b/l multifocal areas of soft tissue and GGO nodularity, mosaic attenuation pattern 07/01/2020 echo: EF 50%, mild LVH, RVSP 22 mmHg, mod/severe LA dilation  04/07/2021: OV with Dr. 04/09/2021 for sleep study. Symptoms of snoring, daytime fatigue, witnessed apneas. Concern for sleep apnea - needs HST for further evaluation. She had CT chest in Feburary 2022 following COVID with ground glass nodularity and air trapping. Her asthma symptoms worsened since having pneumonia. Feels like they improved following her cardiac ablation. Preferred to hold off on follow up chest imaging.   05/10/2022: Today - follow up Patient presents today for follow up to discuss sleep study again. She was last seen for sleep consult in December of 2022. Home sleep study was ordered but unfortunately, she lost her insurance and was unable to pay for the study. She was able to get her medicaid back but is worried that it may run out again the end of January. She has a longstanding history of snoring and has been told that she stops breathing when she is sleeping. She does occasionally wake up gasping. She continues to feel very fatigued during awake hours (currently working 3rd shift which is a change since October). She does struggle with drowsy driving at times. She has fallen asleep while driving before. She has a history of sleep talking; no sleep walking. She was previously referred by cardiology due to new onset  a fib. Now rate controlled.  She denies morning headaches, bruxism, sleep paralysis. No history of narcolepsy or cataplexy.  She goes to bed between 7 and 8 AM after working night shift.  If she does not work that day, she goes to bed between 9 to 10 PM.  Usually falls asleep within 20 minutes.  Rarely takes up to an hour.  Wakes multiple times at night.  If it is a day that she works, she gets up around 3 to 4 PM otherwise she will wake up around 11 AM.  Does not operate any heavy machinery in her job November.  Her weight is up 10 pounds over the last 2 years.  No previous sleep study. No sleep aids.  She has a history of hypertension, atrial fibrillation status post ablation 08/2020, asthma.  No history of stroke.  She is a never smoker.  Drinks alcohol on occasion; average of 1 drink every 2 weeks.  Lives at home with her parents.  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  Family history of heart disease and cancer.  Epworth 23  Allergies  Allergen Reactions   Morphine And Related Anaphylaxis    Immunization History  Administered Date(s) Administered   Influenza-Unspecified 02/20/2014, 03/31/2015, 12/18/2019, 02/10/2020   Pneumococcal Polysaccharide-23 11/02/2012   Tdap 11/03/2010   Zoster Recombinat (Shingrix) 12/18/2019, 05/26/2020    Past Medical History:  Diagnosis Date   ADHD (attention deficit hyperactivity disorder)    Asthma    Depression    Essential hypertension  Paroxysmal atrial fibrillation (HCC)    TMJ arthralgia     Tobacco History: Social History   Tobacco Use  Smoking Status Never  Smokeless Tobacco Never   Counseling given: Not Answered   Outpatient Medications Prior to Visit  Medication Sig Dispense Refill   albuterol (VENTOLIN HFA) 108 (90 Base) MCG/ACT inhaler Inhale 2 puffs into the lungs every 6 (six) hours as needed for wheezing or shortness of breath.     amphetamine-dextroamphetamine (ADDERALL) 20 MG tablet Take 20 mg by mouth 3 (three) times daily.      Azelastine HCl 137 MCG/SPRAY SOLN Place 1 spray into both nostrils as needed.     bisoprolol (ZEBETA) 5 MG tablet TAKE 1/2 TABLET BY MOUTH DAILY (cut in half) (Patient taking differently: Take 2.5-5 mg by mouth daily. 1/2 tablet daily) 45 tablet 3   celecoxib (CELEBREX) 200 MG capsule Take 200 mg by mouth at bedtime.     citalopram (CELEXA) 20 MG tablet Take 20 mg by mouth at bedtime.     losartan (COZAAR) 50 MG tablet Take 50 mg by mouth daily.     rivaroxaban (XARELTO) 20 MG TABS tablet TAKE 1 TABLET BY MOUTH DAILY WITH SUPPER 90 tablet 3   SYMBICORT 80-4.5 MCG/ACT inhaler Inhale 2 puffs into the lungs daily as needed (seasonal allergies).     amLODipine (NORVASC) 2.5 MG tablet Take 1 tablet (2.5 mg total) by mouth daily. (Patient not taking: Reported on 03/15/2022) 30 tablet 6   No facility-administered medications prior to visit.     Review of Systems:   Constitutional: No weight loss or gain, night sweats, fevers, chills, or lassitude. +excessive daytime fatigue HEENT: No headaches, difficulty swallowing, tooth/dental problems, or sore throat. No sneezing, itching, ear ache. +occasional nasal congestion CV:  No chest pain, orthopnea, PND, swelling in lower extremities, anasarca, dizziness, palpitations, syncope Resp: +snoring, witnessed apneas, shortness of breath with exertion (baseline). No excess mucus or change in color of mucus. No productive or non-productive. No hemoptysis. No wheezing.  No chest wall deformity GI:  +heartburn, indigestion (at night). No abdominal pain, nausea, vomiting, diarrhea, change in bowel habits, loss of appetite, bloody stools.  GU: No dysuria, change in color of urine, urgency or frequency.   Skin: No rash, lesions, ulcerations MSK:  No joint pain or swelling.  +joint stiffness.  Neuro: No dizziness or lightheadedness.  Psych: +anxiety (stable). No depression. Mood stable. +sleep disturbance    Physical Exam:  BP 110/64   Pulse (!) 56   Ht 5\' 8"   (1.727 m)   Wt 193 lb 9.6 oz (87.8 kg)   SpO2 100%   BMI 29.44 kg/m   GEN: Pleasant, interactive, well-appearing; overweight; in no acute distress. HEENT:  Normocephalic and atraumatic. PERRLA. Sclera white. Nasal turbinates pink, moist and patent bilaterally. No rhinorrhea present. Oropharynx pink and moist, without exudate or edema. No lesions, ulcerations, or postnasal drip. Mallampati III/IV NECK:  Supple w/ fair ROM. No JVD present. Normal carotid impulses w/o bruits. Thyroid symmetrical with no goiter or nodules palpated. No lymphadenopathy.   CV: RRR, no m/r/g, no peripheral edema. Pulses intact, +2 bilaterally. No cyanosis, pallor or clubbing. PULMONARY:  Unlabored, regular breathing. Clear bilaterally A&P w/o wheezes/rales/rhonchi. No accessory muscle use.  GI: BS present and normoactive. Soft, non-tender to palpation. No organomegaly or masses detected.  MSK: No erythema, warmth or tenderness. Cap refil <2 sec all extrem. No deformities or joint swelling noted.  Neuro: A/Ox3. No focal deficits noted.  Skin: Warm, no lesions or rashe Psych: Normal affect and behavior. Judgement and thought content appropriate.     Lab Results:  CBC    Component Value Date/Time   WBC 6.5 08/19/2020 1309   WBC 6.5 07/02/2020 1058   RBC 4.70 08/19/2020 1309   RBC 4.51 07/02/2020 1058   HGB 14.0 08/19/2020 1309   HCT 41.2 08/19/2020 1309   PLT 255 08/19/2020 1309   MCV 88 08/19/2020 1309   MCH 29.8 08/19/2020 1309   MCH 30.4 07/02/2020 1058   MCHC 34.0 08/19/2020 1309   MCHC 34.9 07/02/2020 1058   RDW 12.1 08/19/2020 1309   LYMPHSABS 2.5 08/19/2020 1309   EOSABS 0.7 (H) 08/19/2020 1309   BASOSABS 0.1 08/19/2020 1309    BMET    Component Value Date/Time   NA 141 08/19/2020 1309   K 4.0 08/19/2020 1309   CL 104 08/19/2020 1309   CO2 24 08/19/2020 1309   GLUCOSE 97 08/19/2020 1309   GLUCOSE 112 (H) 07/02/2020 1058   BUN 23 08/19/2020 1309   CREATININE 0.87 08/19/2020 1309    CALCIUM 9.3 08/19/2020 1309   GFRNONAA >60 07/02/2020 1058   GFRAA >60 04/19/2018 1749    BNP No results found for: "BNP"   Imaging:  No results found.        No data to display          No results found for: "NITRICOXIDE"      Assessment & Plan:   Snoring She has snoring, excessive daytime sleepiness, nocturnal apneic events, drowsy driving, restless sleep. BMI 29. Epworth 23. History of atrial fibrillation. Given this,  I am concerned she could have sleep disordered breathing with obstructive sleep apnea. She will need sleep study for further evaluation.    - discussed how weight can impact sleep and risk for sleep disordered breathing - discussed options to assist with weight loss: combination of diet modification, cardiovascular and strength training exercises   - had an extensive discussion regarding the adverse health consequences related to untreated sleep disordered breathing - specifically discussed the risks for hypertension, coronary artery disease, cardiac dysrhythmias, cerebrovascular disease, and diabetes - lifestyle modification discussed   - discussed how sleep disruption can increase risk of accidents, particularly when driving - safe driving practices were discussed  Patient Instructions  Given your symptoms, I am concerned that you may have sleep disordered breathing with sleep apnea. You will need a sleep study for further evaluation. Someone will contact you to schedule this.  We discussed how untreated sleep apnea puts an individual at risk for cardiac arrhthymias, pulm HTN, DM, stroke and increases their risk for daytime accidents. We also briefly reviewed treatment options including weight loss, side sleeping position, oral appliance, CPAP therapy or referral to ENT for possible surgical options  Use extreme caution when driving and pull over if you become sleepy  Follow up after sleep study with Dr. Halford Chessman or Alanson Aly, or sooner, if needed     Excessive daytime sleepiness See above plan  Persistent atrial fibrillation ([place]) Rate controlled. Anticoagulated on Xarelto. No excessive bruising or bleeding. Follow up with cardiology as scheduled.   Asthma Compensated on current regimen. Managed by PCP. No recent flares/hospitalizations due to breathing.    I spent 35 minutes of dedicated to the care of this patient on the date of this encounter to include pre-visit review of records, face-to-face time with the patient discussing conditions above, post visit ordering of testing, clinical documentation with the  electronic health record, making appropriate referrals as documented, and communicating necessary findings to members of the patients care team.  Clayton Bibles, NP 05/10/2022  Pt aware and understands NP's role.

## 2022-05-10 NOTE — Patient Instructions (Signed)
Given your symptoms, I am concerned that you may have sleep disordered breathing with sleep apnea. You will need a sleep study for further evaluation. Someone will contact you to schedule this.  We discussed how untreated sleep apnea puts an individual at risk for cardiac arrhthymias, pulm HTN, DM, stroke and increases their risk for daytime accidents. We also briefly reviewed treatment options including weight loss, side sleeping position, oral appliance, CPAP therapy or referral to ENT for possible surgical options  Use extreme caution when driving and pull over if you become sleepy  Follow up after sleep study with Dr. Halford Chessman or Alanson Aly, or sooner, if needed

## 2022-05-10 NOTE — Assessment & Plan Note (Signed)
She has snoring, excessive daytime sleepiness, nocturnal apneic events, drowsy driving, restless sleep. BMI 29. Epworth 23. History of atrial fibrillation. Given this,  I am concerned she could have sleep disordered breathing with obstructive sleep apnea. She will need sleep study for further evaluation.    - discussed how weight can impact sleep and risk for sleep disordered breathing - discussed options to assist with weight loss: combination of diet modification, cardiovascular and strength training exercises   - had an extensive discussion regarding the adverse health consequences related to untreated sleep disordered breathing - specifically discussed the risks for hypertension, coronary artery disease, cardiac dysrhythmias, cerebrovascular disease, and diabetes - lifestyle modification discussed   - discussed how sleep disruption can increase risk of accidents, particularly when driving - safe driving practices were discussed  Patient Instructions  Given your symptoms, I am concerned that you may have sleep disordered breathing with sleep apnea. You will need a sleep study for further evaluation. Someone will contact you to schedule this.  We discussed how untreated sleep apnea puts an individual at risk for cardiac arrhthymias, pulm HTN, DM, stroke and increases their risk for daytime accidents. We also briefly reviewed treatment options including weight loss, side sleeping position, oral appliance, CPAP therapy or referral to ENT for possible surgical options  Use extreme caution when driving and pull over if you become sleepy  Follow up after sleep study with Dr. Halford Chessman or Alanson Aly, or sooner, if needed

## 2022-05-10 NOTE — Assessment & Plan Note (Signed)
Rate controlled. Anticoagulated on Xarelto. No excessive bruising or bleeding. Follow up with cardiology as scheduled.

## 2022-05-13 NOTE — Progress Notes (Signed)
Reviewed and agree with assessment/plan.   Alekxander Isola, MD [facility] 05/13/2022, 7:20 AM Pager:  [phone]  

## 2022-05-16 ENCOUNTER — Ambulatory Visit: Payer: Medicaid Other | Admitting: Nurse Practitioner

## 2022-05-16 DIAGNOSIS — R0683 Snoring: Secondary | ICD-10-CM

## 2022-05-16 DIAGNOSIS — G4733 Obstructive sleep apnea (adult) (pediatric): Secondary | ICD-10-CM

## 2022-05-23 DIAGNOSIS — G4733 Obstructive sleep apnea (adult) (pediatric): Secondary | ICD-10-CM | POA: Diagnosis not present

## 2022-05-30 NOTE — Progress Notes (Signed)
Please notify patient Dana Ortiz revealed severe OSA with 35.7/h, SpO2 low 70%. Needs follow up to discuss starting CPAP therapy for management. Thanks.

## 2022-06-27 ENCOUNTER — Encounter (HOSPITAL_COMMUNITY): Payer: Self-pay | Admitting: Physician Assistant

## 2022-06-27 ENCOUNTER — Ambulatory Visit (HOSPITAL_COMMUNITY)
Admission: RE | Admit: 2022-06-27 | Discharge: 2022-06-27 | Disposition: A | Discharge status: Home / Self Care | Payer: Medicaid Other | Source: Ambulatory Visit | Attending: Physician Assistant | Admitting: Physician Assistant

## 2022-06-27 ENCOUNTER — Encounter: Payer: Self-pay | Admitting: Cardiovascular Disease

## 2022-06-27 VITALS — BP 104/68 | HR 115 | Ht 68.0 in | Wt 192.0 lb

## 2022-06-27 DIAGNOSIS — I4811 Longstanding persistent atrial fibrillation: Secondary | ICD-10-CM | POA: Diagnosis present

## 2022-06-27 DIAGNOSIS — Z79899 Other long term (current) drug therapy: Secondary | ICD-10-CM | POA: Diagnosis not present

## 2022-06-27 DIAGNOSIS — I10 Essential (primary) hypertension: Secondary | ICD-10-CM | POA: Insufficient documentation

## 2022-06-27 DIAGNOSIS — Z7901 Long term (current) use of anticoagulants: Secondary | ICD-10-CM | POA: Diagnosis not present

## 2022-06-27 DIAGNOSIS — G4733 Obstructive sleep apnea (adult) (pediatric): Secondary | ICD-10-CM | POA: Insufficient documentation

## 2022-06-27 NOTE — Patient Instructions (Addendum)
Have labs drawn at [facility] -- anytime between March 20-22nd; orders attached  Cardioversion scheduled for: Tuesday, March 26th   - Arrive at the [facility] and go to admitting at 830am   - Do not eat or drink anything after midnight the night prior to your procedure.   - Take all your morning medication (except diabetic medications) with a sip of water prior to arrival.  - You will not be able to drive home after your procedure.    - Do NOT miss any doses of your blood thinner - if you should miss a dose please notify our office immediately.   - If you feel as if you go back into normal rhythm prior to scheduled cardioversion, please notify our office immediately.   If your procedure is canceled in the cardioversion suite you will be charged a cancellation fee.

## 2022-06-27 NOTE — Progress Notes (Signed)
Primary Care Physician: Bridget Hartshorn, NP Primary Cardiologist: Dr Domenic Polite Primary Electrophysiologist: Dr Myles Gip  Referring Physician: Dr Stasia Cavalier Dana Ortiz is a 52 y.o. female with a history of HTN and atrial fibrillation who presents for follow up in the [facility]. Patient is on Xarelto for a CHADS2VASC score of 2. She was seen by Dr Domenic Polite on 05/12/20 and was believed to be in persistent atrial fibrillation for several months at least, if not longer, per the patient's Apple Watch. She does have symptoms of fatigue and dyspnea with activity while in afib. She has been intolerant of metoprolol and diltiazem in the past due to side effects. She denies significant alcohol use. Patient is s/p DCCV on 07/06/20 with quick return of her afib just under two days later. She does report she felt "great" for those two days which much more energy. Patient is s/p afib ablation 09/12/20.   On follow up today, patient reports that on 06/25/22 after work she had symptoms of palpitations, SOB, dizziness, and chest heaviness. Her smart watch showed afib and ECG today also shows she is back in afib. This is her first episode since her ablation in 2022. He has been diagnosed with severe OSA and has a visit with Dr Elsworth Soho to discuss treatment.   Today, she denies symptoms of orthopnea, PND, lower extremity edema, presyncope, syncope, bleeding, or neurologic sequela. The patient is tolerating medications without difficulties and is otherwise without complaint today.    Atrial Fibrillation Risk Factors:  she does have symptoms or diagnosis of sleep apnea. she does not have a history of rheumatic fever. she does not have a history of alcohol use. The patient does not have a history of early familial atrial fibrillation or other arrhythmias.  she has a BMI of Body mass index is 29.19 kg/m.Marland Kitchen Filed Weights   06/27/22 1052  Weight: 87.1 kg    Family History  Problem  Relation Age of Onset   Diabetes Mother    Glaucoma Mother    Heart Problems Brother      Atrial Fibrillation Management history:  Previous antiarrhythmic drugs: flecainide  Previous cardioversions: 07/06/20 Previous ablations: 09/12/20 CHADS2VASC score: 2 Anticoagulation history: Xarelto   Past Medical History:  Diagnosis Date   ADHD (attention deficit hyperactivity disorder)    Asthma    Depression    Essential hypertension    Paroxysmal atrial fibrillation (HCC)    TMJ arthralgia    Past Surgical History:  Procedure Laterality Date   ATRIAL FIBRILLATION ABLATION N/A 09/11/2020   Procedure: ATRIAL FIBRILLATION ABLATION;  Surgeon: Thompson Grayer, MD;  Location: [facility];  Service: Cardiovascular;  Laterality: N/A;   CARDIOVERSION N/A 07/06/2020   Procedure: CARDIOVERSION;  Surgeon: Larey Dresser, MD;  Location: [facility] ENDOSCOPY;  Service: Cardiovascular;  Laterality: N/A;   TEE WITHOUT CARDIOVERSION N/A 09/10/2020   Procedure: TRANSESOPHAGEAL ECHOCARDIOGRAM (TEE);  Surgeon: Lelon Perla, MD;  Location: [facility] ENDOSCOPY;  Service: Cardiovascular;  Laterality: N/A;   TONSILLECTOMY     TUBAL LIGATION      Current Outpatient Medications  Medication Sig Dispense Refill   albuterol (VENTOLIN HFA) 108 (90 Base) MCG/ACT inhaler Inhale 2 puffs into the lungs every 6 (six) hours as needed for wheezing or shortness of breath.     amphetamine-dextroamphetamine (ADDERALL) 20 MG tablet Take 20 mg by mouth 3 (three) times daily.     Azelastine HCl 137 MCG/SPRAY SOLN Place 1 spray into both  nostrils as needed.     bisoprolol (ZEBETA) 5 MG tablet TAKE 1/2 TABLET BY MOUTH DAILY (cut in half) 45 tablet 3   celecoxib (CELEBREX) 200 MG capsule Take 200 mg by mouth at bedtime.     citalopram (CELEXA) 20 MG tablet Take 20 mg by mouth at bedtime.     losartan (COZAAR) 50 MG tablet Take 50 mg by mouth daily.     rivaroxaban (XARELTO) 20 MG TABS tablet TAKE 1 TABLET BY MOUTH DAILY WITH  SUPPER 90 tablet 3   SYMBICORT 80-4.5 MCG/ACT inhaler Inhale 2 puffs into the lungs daily as needed (seasonal allergies).     No current facility-administered medications for this encounter.    Allergies  Allergen Reactions   Morphine And Related Anaphylaxis    Social History   Socioeconomic History   Marital status: Widowed    Spouse name: Not on file   Number of children: Not on file   Years of education: Not on file   Highest education level: Not on file  Occupational History   Not on file  Tobacco Use   Smoking status: Never   Smokeless tobacco: Never   Tobacco comments:    Never smoke 06/27/22  Substance and Sexual Activity   Alcohol use: Yes    Alcohol/week: 7.0 standard drinks of alcohol    Types: 7 Glasses of wine per week    Comment: one glass of wine weekly   Drug use: No   Sexual activity: Not on file  Other Topics Concern   Not on file  Social History Narrative   Lives with husband, no children, 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  Physical Exam: Vitals:   06/27/22 1052  BP: 104/68  Pulse: (!) 115  Weight: 87.1 kg  Height: '5\' 8"'$  (1.727 m)     GEN- The patient is a well appearing female, alert and oriented x 3 today.   HEENT-head normocephalic, atraumatic, sclera clear, conjunctiva pink, hearing intact, trachea midline. Lungs- Clear to ausculation bilaterally, normal work of breathing Heart- irregular rate and rhythm, no murmurs, rubs or gallops  GI- soft, NT, ND, + BS Extremities- no clubbing, cyanosis, or edema MS- no significant deformity or atrophy Skin- no rash or lesion Psych- euthymic mood, full  affect Neuro- strength and sensation are intact   Wt Readings from Last 3 Encounters:  06/27/22 87.1 kg  05/10/22 87.8 kg  05/06/22 87.2 kg    EKG today demonstrates  Afib Vent. rate 115 BPM PR interval * ms QRS duration 86 ms QT/QTcB 292/403 ms  Echo 07/01/20 demonstrated  1. Left ventricular ejection fraction, by estimation, is approximately  50%. The left ventricle has low normal function. The left ventricle  demonstrates global hypokinesis. There is mild left ventricular  hypertrophy. Left ventricular diastolic parameters   are indeterminate.   2. Right ventricular systolic function is normal. The right ventricular  size is normal. There is normal pulmonary artery systolic pressure. The  estimated right ventricular systolic pressure is [id number] mmHg.   3. Left atrial size was moderately to severely dilated.   4. Right atrial size was upper  normal.   5. There is a trivial pericardial effusion posterior to the left  ventricle.   6. The mitral valve is grossly normal. Mild to moderate mitral valve  regurgitation made up of two jets.   7. The aortic valve is tricuspid. Aortic valve regurgitation is not  visualized.   8. The inferior vena cava is normal in size with greater than 50%  respiratory variability, suggesting right atrial pressure of 3 mmHg.   9. Cannot exclude small left to right interatrial shunt, possible PFO.   Comparison(s): Echocardiogram done 09/29/16 showed an EF of 55-60%.   Epic records are reviewed at length today  CHA2DS2-VASc Score = 2  The patient's score is based upon: CHF History: 0 HTN History: 1 Diabetes History: 0 Stroke History: 0 Vascular Disease History: 0 Age Score: 0 Gender Score: 1        ASSESSMENT AND PLAN: 1. Longstanding Persistent Atrial Fibrillation (ICD10:  I48.11) The patient's CHA2DS2-VASc score is 2, indicating a 2.2% annual risk of stroke.   S/p afib ablation 09/12/20 She has done well since her ablation until the last  few days. We discussed rhythm control options. Will plan for DCCV. If she needs further rhythm control, could consider dofetilide vs repeat ablation.  Continue bisoprolol 2.5 mg daily Continue Xarelto 20 mg daily    2. OSA Severe on HST Has follow up with Dr Elsworth Soho on 06/29/22.  3. HTN Stable, no changes today.  4. MR Mild to moderate. Followed by Dr Domenic Polite.    Follow up in the AF clinic 1-2 weeks post DCCV.    [facility] [address] [place], [place] [address] [phone] 06/27/2022 11:49 AM

## 2022-06-29 ENCOUNTER — Ambulatory Visit (HOSPITAL_BASED_OUTPATIENT_CLINIC_OR_DEPARTMENT_OTHER): Payer: Medicaid Other | Admitting: Pulmonary Disease

## 2022-07-01 ENCOUNTER — Encounter (HOSPITAL_BASED_OUTPATIENT_CLINIC_OR_DEPARTMENT_OTHER): Payer: Self-pay | Admitting: Pulmonary Disease

## 2022-07-01 ENCOUNTER — Ambulatory Visit (INDEPENDENT_AMBULATORY_CARE_PROVIDER_SITE_OTHER): Payer: Medicaid Other | Admitting: Pulmonary Disease

## 2022-07-01 VITALS — BP 118/70 | HR 95 | Temp 98.3°F | Ht 68.0 in | Wt 190.8 lb

## 2022-07-01 DIAGNOSIS — I4819 Other persistent atrial fibrillation: Secondary | ICD-10-CM

## 2022-07-01 DIAGNOSIS — G4733 Obstructive sleep apnea (adult) (pediatric): Secondary | ICD-10-CM | POA: Diagnosis not present

## 2022-07-01 DIAGNOSIS — G4719 Other hypersomnia: Secondary | ICD-10-CM

## 2022-07-01 NOTE — Progress Notes (Signed)
   Subjective:    Patient ID: Dana Ortiz, female    DOB: 04-13-1971, 52 y.o.   MRN: [id number]  HPI  52 year old woman, never smoker followed for snoring and daytime fatigue. She was initially seen for sleep consult 04/07/2021 by Dr. Halford Chessman.   Past medical history significant for PAF on Xarelto, asthma, ADHD, TMJ.  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  Pt has been having issues with a-fib and is hoping to be able to be started on CPAP ASAP.   Sleep consult was for excessive daytime somnolence and fatigue.  Complicated by night shift work.  On workdays bedtime is between 8 AM and 3 PM.  Her days off she can sleep at 10 PM and stay in bed and 11 AM. She has failed ablation in the past, currently on bisoprolol and plan is for DCCV on 3/26 but electrophysiologist would like OSA to be treated first. She is accompanied by her mother who corroborates sleep history She is maintained on Adderall for ADHD  Reviewed EP visit Significant tests/ events reviewed   04/2022 HST >> AHI 36/h, low sat 70% 05/2020 CTA chest: b/l multifocal areas of soft tissue and GGO nodularity, mosaic attenuation pattern 07/01/2020 echo: EF 50%, mild LVH, RVSP 22 mmHg, mod/severe LA dilation  Review of Systems neg for any significant sore throat, dysphagia, itching, sneezing, nasal congestion or excess/ purulent secretions, fever, chills, sweats, unintended wt loss, pleuritic or exertional cp, hempoptysis, orthopnea pnd or change in chronic leg swelling. Also denies presyncope, palpitations, heartburn, abdominal pain, nausea, vomiting, diarrhea or change in bowel or urinary habits, dysuria,hematuria, rash, arthralgias, visual complaints, headache, numbness weakness or ataxia.     Objective:   Physical Exam  Gen. Pleasant, well nourished, in no distress ENT - no  lesions, no post nasal drip Neck: No JVD, no thyromegaly, no carotid bruits Lungs: no use of accessory muscles, no dullness to percussion, decreased without rales or rhonchi  Cardiovascular: Rhythm regular, heart sounds  normal, no murmurs or gallops, no peripheral edema Musculoskeletal: No deformities, no cyanosis or clubbing , no tremors       Assessment & Plan:

## 2022-07-01 NOTE — Patient Instructions (Signed)
  AutoCPAP 5-15 cm with nasal cradle mask

## 2022-07-01 NOTE — Assessment & Plan Note (Signed)
We discussed sleep study results.  This is classified as severe OSA with severe desaturation and clearly needs to be corrected before DCCV is attempted.  We will initiate auto CPAP 5 to 15 cm with nasal cradle mask.  For some reason if she does not tolerate then she may need a fullface mask.  On hopeful that she can settle down with therapy.  Mouthguard would be suboptimal she has already tried this OTC and it did not work for her. I also discussed that hypoglossal nerve stimulator implant is not first-line therapy We discussed benefits for atrial fibrillation  The pathophysiology of obstructive sleep apnea , it's cardiovascular consequences & modes of treatment including CPAP were discused with the patient in detail & they evidenced understanding.

## 2022-07-01 NOTE — Assessment & Plan Note (Signed)
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  Difficult to tease out residual hypersomnolence related to shiftwork. She is also maintained on Adderall for ADHD These are all issues that we will need to be sorted out going forward once she settles down with CPAP therapy

## 2022-07-05 ENCOUNTER — Encounter: Payer: Self-pay | Admitting: Pulmonary Disease

## 2022-07-05 NOTE — Telephone Encounter (Signed)
PCCs can you please fax this patients CPAP order to [place]? Thank you!

## 2022-07-07 ENCOUNTER — Other Ambulatory Visit: Payer: Self-pay

## 2022-07-07 ENCOUNTER — Encounter: Payer: Self-pay | Admitting: Pulmonary Disease

## 2022-07-07 DIAGNOSIS — G4733 Obstructive sleep apnea (adult) (pediatric): Secondary | ICD-10-CM

## 2022-07-11 ENCOUNTER — Encounter: Payer: Self-pay | Admitting: Cardiovascular Disease

## 2022-07-12 ENCOUNTER — Encounter (HOSPITAL_COMMUNITY): Admission: RE | Payer: Self-pay | Source: Home / Self Care

## 2022-07-12 ENCOUNTER — Ambulatory Visit (HOSPITAL_COMMUNITY): Admission: RE | Admit: 2022-07-12 | Payer: Medicaid Other | Source: Home / Self Care | Admitting: Cardiology

## 2022-07-12 SURGERY — CARDIOVERSION
Anesthesia: Monitor Anesthesia Care

## 2022-07-28 ENCOUNTER — Ambulatory Visit (HOSPITAL_COMMUNITY): Payer: Medicaid Other | Admitting: Physician Assistant

## 2022-08-15 ENCOUNTER — Encounter: Payer: Self-pay | Admitting: Cardiovascular Disease

## 2022-08-15 ENCOUNTER — Encounter (HOSPITAL_COMMUNITY): Payer: Self-pay | Admitting: Physician Assistant

## 2022-08-15 ENCOUNTER — Ambulatory Visit (HOSPITAL_COMMUNITY)
Admission: RE | Admit: 2022-08-15 | Discharge: 2022-08-15 | Disposition: A | Discharge status: Home / Self Care | Payer: Self-pay | Source: Ambulatory Visit | Attending: Physician Assistant | Admitting: Physician Assistant

## 2022-08-15 VITALS — BP 124/90 | HR 106 | Ht 68.0 in | Wt 188.6 lb

## 2022-08-15 DIAGNOSIS — G4733 Obstructive sleep apnea (adult) (pediatric): Secondary | ICD-10-CM | POA: Insufficient documentation

## 2022-08-15 DIAGNOSIS — I4811 Longstanding persistent atrial fibrillation: Secondary | ICD-10-CM | POA: Insufficient documentation

## 2022-08-15 DIAGNOSIS — Z79899 Other long term (current) drug therapy: Secondary | ICD-10-CM | POA: Insufficient documentation

## 2022-08-15 DIAGNOSIS — I4819 Other persistent atrial fibrillation: Secondary | ICD-10-CM

## 2022-08-15 DIAGNOSIS — I340 Nonrheumatic mitral (valve) insufficiency: Secondary | ICD-10-CM | POA: Insufficient documentation

## 2022-08-15 DIAGNOSIS — Z7901 Long term (current) use of anticoagulants: Secondary | ICD-10-CM | POA: Insufficient documentation

## 2022-08-15 DIAGNOSIS — I10 Essential (primary) hypertension: Secondary | ICD-10-CM | POA: Insufficient documentation

## 2022-08-15 LAB — CBC
HCT: 41.2 % (ref 36.0–46.0)
Hemoglobin: 13.9 g/dL (ref 12.0–15.0)
MCH: 28.9 pg (ref 26.0–34.0)
MCHC: 33.7 g/dL (ref 30.0–36.0)
MCV: 85.7 fL (ref 80.0–100.0)
Platelets: 221 10*3/uL (ref 150–400)
RBC: 4.81 MIL/uL (ref 3.87–5.11)
RDW: 12.5 % (ref 11.5–15.5)
WBC: 6.2 10*3/uL (ref 4.0–10.5)
nRBC: 0 % (ref 0.0–0.2)

## 2022-08-15 LAB — BASIC METABOLIC PANEL
Anion gap: 5 (ref 5–15)
BUN: 20 mg/dL (ref 6–20)
CO2: 27 mmol/L (ref 22–32)
Calcium: 8.9 mg/dL (ref 8.9–10.3)
Chloride: 103 mmol/L (ref 98–111)
Creatinine, Ser: 0.77 mg/dL (ref 0.44–1.00)
GFR, Estimated: >60 mL/min (ref >60)
Glucose, Bld: 127 mg/dL — ABNORMAL HIGH (ref 70–99)
Potassium: 4.5 mmol/L (ref 3.5–5.1)
Sodium: 135 mmol/L (ref 135–145)

## 2022-08-15 NOTE — Progress Notes (Signed)
Primary Care Physician: Rebecka Apley, NP Primary Cardiologist: Dr Diona Browner Primary Electrophysiologist: Dr Nelly Laurence  Referring Physician: Dr Alvan Dame Oluwakemi Dana Ortiz is a 52 y.o. female with a history of HTN, OSA, and atrial fibrillation who presents for follow up in the [facility]. Patient is on Xarelto for a CHADS2VASC score of 2. She was seen by Dr Diona Browner on 05/12/20 and was believed to be in persistent atrial fibrillation for several months at least, if not longer, per the patient's Apple Watch. She does have symptoms of fatigue and dyspnea with activity while in afib. She has been intolerant of metoprolol and diltiazem in the past due to side effects. She denies significant alcohol use. Patient is s/p DCCV on 07/06/20 with quick return of her afib just under two days later. She does report she felt "great" for those two days which much more energy. Patient is s/p afib ablation 09/12/20.   Patient reported on 06/25/22 after work she had symptoms of palpitations, SOB, dizziness, and chest heaviness. Her smart watch showed afib. This is her first episode since her ablation in 2022. She deferred DCCV until she could get started on CPAP for her severe OSA.  On follow up today, patient remains in afib with symptoms of fatigue. Although, she does note that her fatigue has improved since starting CPAP. No bleeding issues on anticoagulation.   Today, she denies symptoms of orthopnea, PND, lower extremity edema, presyncope, syncope, bleeding, or neurologic sequela. The patient is tolerating medications without difficulties and is otherwise without complaint today.    Atrial Fibrillation Risk Factors:  she does have symptoms or diagnosis of sleep apnea. She is compliant with CPAP therapy.  she does not have a history of rheumatic fever. she does not have a history of alcohol use. The patient does not have a history of early familial atrial fibrillation or other  arrhythmias.  she has a BMI of Body mass index is 28.68 kg/m.Marland Kitchen Filed Weights   08/15/22 0925  Weight: 85.5 kg   Family History  Problem Relation Age of Onset   Diabetes Mother    Glaucoma Mother    Heart Problems Brother      Atrial Fibrillation Management history:  Previous antiarrhythmic drugs: flecainide  Previous cardioversions: 07/06/20 Previous ablations: 09/12/20 CHADS2VASC score: 2 Anticoagulation history: Xarelto   Past Medical History:  Diagnosis Date   ADHD (attention deficit hyperactivity disorder)    Asthma    Depression    Essential hypertension    Paroxysmal atrial fibrillation (HCC)    TMJ arthralgia    Past Surgical History:  Procedure Laterality Date   ATRIAL FIBRILLATION ABLATION N/A 09/11/2020   Procedure: ATRIAL FIBRILLATION ABLATION;  Surgeon: Hillis Range, MD;  [facility] IN[facility] LAB;  Service: Cardiovascular;  Laterality: N/A;   CARDIOVERSION N/A 07/06/2020   Procedure: CARDIOVERSION;  Surgeon: Laurey Morale, MD;  Location: [facility] ENDOSCOPY;  Service: Cardiovascular;  Laterality: N/A;   TEE WITHOUT CARDIOVERSION N/A 09/10/2020   Procedure: TRANSESOPHAGEAL ECHOCARDIOGRAM (TEE);  Surgeon: Lewayne Bunting, MD;  Location: [facility] ENDOSCOPY;  Service: Cardiovascular;  Laterality: N/A;   TONSILLECTOMY     TUBAL LIGATION      Current Outpatient Medications  Medication Sig Dispense Refill   albuterol (VENTOLIN HFA) 108 (90 Base) MCG/ACT inhaler Inhale 2 puffs into the lungs every 6 (six) hours as needed for wheezing or shortness of breath.     amphetamine-dextroamphetamine (ADDERALL) 20 MG tablet Take 20 mg by  mouth 3 (three) times daily.     Azelastine HCl 137 MCG/SPRAY SOLN Place 1 spray into both nostrils as needed.     bisoprolol (ZEBETA) 5 MG tablet TAKE 1/2 TABLET BY MOUTH DAILY (cut in half) 45 tablet 3   celecoxib (CELEBREX) 200 MG capsule Take 200 mg by mouth at bedtime.     citalopram (CELEXA) 20 MG tablet Take 20 mg by mouth at bedtime.      losartan (COZAAR) 50 MG tablet Take 50 mg by mouth daily.     rivaroxaban (XARELTO) 20 MG TABS tablet TAKE 1 TABLET BY MOUTH DAILY WITH SUPPER 90 tablet 3   SYMBICORT 80-4.5 MCG/ACT inhaler Inhale 2 puffs into the lungs daily as needed (seasonal allergies).     buPROPion (WELLBUTRIN) 75 MG tablet Take by mouth.     No current facility-administered medications for this encounter.    Allergies  Allergen Reactions   Morphine And Related Anaphylaxis    Social History   Socioeconomic History   Marital status: Widowed    Spouse name: Not on file   Number of children: Not on file   Years of education: Not on file   Highest education level: Not on file  Occupational History   Not on file  Tobacco Use   Smoking status: Never   Smokeless tobacco: Never   Tobacco comments:    Never smoke 06/27/22  Substance and Sexual Activity   Alcohol use: Yes    Alcohol/week: 7.0 standard drinks of alcohol    Types: 7 Glasses of wine per week    Comment: one glass of wine weekly 08/15/22   Drug use: No   Sexual activity: Not on file  Other Topics Concern   Not on file  Social History Narrative   Lives with husband, no children, 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  Physical Exam: Vitals:   08/15/22 0925  BP: (!) 124/90  Pulse: (!) 106  Weight: 85.5 kg  Height: 5\' 8"  (1.727 m)     GEN- The patient is a well appearing female, alert and oriented x 3 today.   HEENT-head normocephalic, atraumatic, sclera clear, conjunctiva pink, hearing intact, trachea midline. Lungs- Clear to ausculation bilaterally, normal work of breathing Heart- irregular rate and  rhythm, no murmurs, rubs or gallops  GI- soft, NT, ND, + BS Extremities- no clubbing, cyanosis, or edema MS- no significant deformity or atrophy Skin- no rash or lesion Psych- euthymic mood, full affect Neuro- strength and sensation are intact   Wt Readings from Last 3 Encounters:  08/15/22 85.5 kg  07/01/22 86.5 kg  06/27/22 87.1 kg    EKG today demonstrates  Afib Vent. rate 106 BPM PR interval * ms QRS duration 84 ms QT/QTcB 296/393 ms  Echo 07/01/20 demonstrated  1. Left ventricular ejection fraction, by estimation, is approximately  50%. The left ventricle has low normal function. The left ventricle  demonstrates global hypokinesis. There is mild left ventricular  hypertrophy. Left ventricular diastolic parameters   are indeterminate.   2. Right ventricular systolic function is normal. The right ventricular  size is normal. There is normal  pulmonary artery systolic pressure. The  estimated right ventricular systolic pressure is 22.0 mmHg.   3. Left atrial size was moderately to severely dilated.   4. Right atrial size was upper normal.   5. There is a trivial pericardial effusion posterior to the left  ventricle.   6. The mitral valve is grossly normal. Mild to moderate mitral valve  regurgitation made up of two jets.   7. The aortic valve is tricuspid. Aortic valve regurgitation is not  visualized.   8. The inferior vena cava is normal in size with greater than 50%  respiratory variability, suggesting right atrial pressure of 3 mmHg.   9. Cannot exclude small left to right interatrial shunt, possible PFO.   Comparison(s): Echocardiogram done 09/29/16 showed an EF of 55-60%.   Epic records are reviewed at length today  CHA2DS2-VASc Score = 2  The patient's score is based upon: CHF History: 0 HTN History: 1 Diabetes History: 0 Stroke History: 0 Vascular Disease History: 0 Age Score: 0 Gender Score: 1       ASSESSMENT AND PLAN: 1. Longstanding Persistent  Atrial Fibrillation (ICD10:  I48.11) The patient's CHA2DS2-VASc score is 2, indicating a 2.2% annual risk of stroke.   S/p afib ablation 09/12/20 Patient remains in symptomatic afib. Patient deferred DCCV until after she was able to start on CPAP. Will plan for DCCV. If she needs further rhythm control, could consider dofetilide vs repeat ablation.  Continue bisoprolol 5 mg daily for now, decrease back to 2.5 mg daily post DCCV.  Continue Xarelto 20 mg daily    2. OSA Severe on HST Followed by Dr Vassie Loll. Encouraged compliance with CPAP therapy.  3. HTN Stable, no changes today.  4. VHD Mild to moderate MR Followed by Dr Diona Browner.    Follow up in the AF clinic post DCCV.    Jorja Loa PA-C Afib Clinic [facility] [address] [place], [place] [address] [phone] 08/15/2022 9:30 AM

## 2022-08-15 NOTE — Patient Instructions (Addendum)
Cardioversion scheduled for: Monday, May 6th   - Arrive at the [facility] and go to admitting at 8am   - Do not eat or drink anything after midnight the night prior to your procedure.   - Take all your morning medication (except diabetic medications) with a sip of water prior to arrival.  - You will not be able to drive home after your procedure.    - Do NOT miss any doses of your blood thinner - if you should miss a dose please notify our office immediately.   - If you feel as if you go back into normal rhythm prior to scheduled cardioversion, please notify our office immediately.   If your procedure is canceled in the cardioversion suite you will be charged a cancellation fee.

## 2022-08-15 NOTE — H&P (View-Only) (Signed)
  Primary Care Physician: Hemberg, Katherine V, NP Primary Cardiologist: Dr McDowell Primary Electrophysiologist: Dr Mealor  Referring Physician: Dr McDowell   Dana Ortiz is a 52 y.o. female with a history of HTN, OSA, and atrial fibrillation who presents for follow up in the  Atrial Fibrillation Clinic. Patient is on Xarelto for a CHADS2VASC score of 2. She was seen by Dr McDowell on 05/12/20 and was believed to be in persistent atrial fibrillation for several months at least, if not longer, per the patient's Apple Watch. She does have symptoms of fatigue and dyspnea with activity while in afib. She has been intolerant of metoprolol and diltiazem in the past due to side effects. She denies significant alcohol use. Patient is s/p DCCV on 07/06/20 with quick return of her afib just under two days later. She does report she felt "great" for those two days which much more energy. Patient is s/p afib ablation 09/12/20.   Patient reported on 06/25/22 after work she had symptoms of palpitations, SOB, dizziness, and chest heaviness. Her smart watch showed afib. This is her first episode since her ablation in 2022. She deferred DCCV until she could get started on CPAP for her severe OSA.  On follow up today, patient remains in afib with symptoms of fatigue. Although, she does note that her fatigue has improved since starting CPAP. No bleeding issues on anticoagulation.   Today, she denies symptoms of orthopnea, PND, lower extremity edema, presyncope, syncope, bleeding, or neurologic sequela. The patient is tolerating medications without difficulties and is otherwise without complaint today.    Atrial Fibrillation Risk Factors:  she does have symptoms or diagnosis of sleep apnea. She is compliant with CPAP therapy.  she does not have a history of rheumatic fever. she does not have a history of alcohol use. The patient does not have a history of early familial atrial fibrillation or other  arrhythmias.  she has a BMI of Body mass index is 28.68 kg/m.. Filed Weights   08/15/22 0925  Weight: 85.5 kg   Family History  Problem Relation Age of Onset   Diabetes Mother    Glaucoma Mother    Heart Problems Brother      Atrial Fibrillation Management history:  Previous antiarrhythmic drugs: flecainide  Previous cardioversions: 07/06/20 Previous ablations: 09/12/20 CHADS2VASC score: 2 Anticoagulation history: Xarelto   Past Medical History:  Diagnosis Date   ADHD (attention deficit hyperactivity disorder)    Asthma    Depression    Essential hypertension    Paroxysmal atrial fibrillation (HCC)    TMJ arthralgia    Past Surgical History:  Procedure Laterality Date   ATRIAL FIBRILLATION ABLATION N/A 09/11/2020   Procedure: ATRIAL FIBRILLATION ABLATION;  Surgeon: Allred, James, MD;  Location: [facility] INVASIVE CV LAB;  Service: Cardiovascular;  Laterality: N/A;   CARDIOVERSION N/A 07/06/2020   Procedure: CARDIOVERSION;  Surgeon: McLean, Dalton S, MD;  Location: [facility] ENDOSCOPY;  Service: Cardiovascular;  Laterality: N/A;   TEE WITHOUT CARDIOVERSION N/A 09/10/2020   Procedure: TRANSESOPHAGEAL ECHOCARDIOGRAM (TEE);  Surgeon: Crenshaw, Brian S, MD;  Location: [facility] ENDOSCOPY;  Service: Cardiovascular;  Laterality: N/A;   TONSILLECTOMY     TUBAL LIGATION      Current Outpatient Medications  Medication Sig Dispense Refill   albuterol (VENTOLIN HFA) 108 (90 Base) MCG/ACT inhaler Inhale 2 puffs into the lungs every 6 (six) hours as needed for wheezing or shortness of breath.     amphetamine-dextroamphetamine (ADDERALL) 20 MG tablet Take 20 mg by   mouth 3 (three) times daily.     Azelastine HCl 137 MCG/SPRAY SOLN Place 1 spray into both nostrils as needed.     bisoprolol (ZEBETA) 5 MG tablet TAKE 1/2 TABLET BY MOUTH DAILY (cut in half) 45 tablet 3   celecoxib (CELEBREX) 200 MG capsule Take 200 mg by mouth at bedtime.     citalopram (CELEXA) 20 MG tablet Take 20 mg by mouth at bedtime.      losartan (COZAAR) 50 MG tablet Take 50 mg by mouth daily.     rivaroxaban (XARELTO) 20 MG TABS tablet TAKE 1 TABLET BY MOUTH DAILY WITH SUPPER 90 tablet 3   SYMBICORT 80-4.5 MCG/ACT inhaler Inhale 2 puffs into the lungs daily as needed (seasonal allergies).     buPROPion (WELLBUTRIN) 75 MG tablet Take by mouth.     No current facility-administered medications for this encounter.    Allergies  Allergen Reactions   Morphine And Related Anaphylaxis    Social History   Socioeconomic History   Marital status: Widowed    Spouse name: Not on file   Number of children: Not on file   Years of education: Not on file   Highest education level: Not on file  Occupational History   Not on file  Tobacco Use   Smoking status: Never   Smokeless tobacco: Never   Tobacco comments:    Never smoke 06/27/22  Substance and Sexual Activity   Alcohol use: Yes    Alcohol/week: 7.0 standard drinks of alcohol    Types: 7 Glasses of wine per week    Comment: one glass of wine weekly 08/15/22   Drug use: No   Sexual activity: Not on file  Other Topics Concern   Not on file  Social History Narrative   Lives with husband, no children, 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  Physical Exam: Vitals:   08/15/22 0925  BP: (!) 124/90  Pulse: (!) 106  Weight: 85.5 kg  Height: 5' 8" (1.727 m)     GEN- The patient is a well appearing female, alert and oriented x 3 today.   HEENT-head normocephalic, atraumatic, sclera clear, conjunctiva pink, hearing intact, trachea midline. Lungs- Clear to ausculation bilaterally, normal work of breathing Heart- irregular rate and  rhythm, no murmurs, rubs or gallops  GI- soft, NT, ND, + BS Extremities- no clubbing, cyanosis, or edema MS- no significant deformity or atrophy Skin- no rash or lesion Psych- euthymic mood, full affect Neuro- strength and sensation are intact   Wt Readings from Last 3 Encounters:  08/15/22 85.5 kg  07/01/22 86.5 kg  06/27/22 87.1 kg    EKG today demonstrates  Afib Vent. rate 106 BPM PR interval * ms QRS duration 84 ms QT/QTcB 296/393 ms  Echo 07/01/20 demonstrated  1. Left ventricular ejection fraction, by estimation, is approximately  50%. The left ventricle has low normal function. The left ventricle  demonstrates global hypokinesis. There is mild left ventricular  hypertrophy. Left ventricular diastolic parameters   are indeterminate.   2. Right ventricular systolic function is normal. The right ventricular  size is normal. There is normal   pulmonary artery systolic pressure. The  estimated right ventricular systolic pressure is 22.0 mmHg.   3. Left atrial size was moderately to severely dilated.   4. Right atrial size was upper normal.   5. There is a trivial pericardial effusion posterior to the left  ventricle.   6. The mitral valve is grossly normal. Mild to moderate mitral valve  regurgitation made up of two jets.   7. The aortic valve is tricuspid. Aortic valve regurgitation is not  visualized.   8. The inferior vena cava is normal in size with greater than 50%  respiratory variability, suggesting right atrial pressure of 3 mmHg.   9. Cannot exclude small left to right interatrial shunt, possible PFO.   Comparison(s): Echocardiogram done 09/29/16 showed an EF of 55-60%.   Epic records are reviewed at length today  CHA2DS2-VASc Score = 2  The patient's score is based upon: CHF History: 0 HTN History: 1 Diabetes History: 0 Stroke History: 0 Vascular Disease History: 0 Age Score: 0 Gender Score: 1       ASSESSMENT AND PLAN: 1. Longstanding Persistent  Atrial Fibrillation (ICD10:  I48.11) The patient's CHA2DS2-VASc score is 2, indicating a 2.2% annual risk of stroke.   S/p afib ablation 09/12/20 Patient remains in symptomatic afib. Patient deferred DCCV until after she was able to start on CPAP. Will plan for DCCV. If she needs further rhythm control, could consider dofetilide vs repeat ablation.  Continue bisoprolol 5 mg daily for now, decrease back to 2.5 mg daily post DCCV.  Continue Xarelto 20 mg daily    2. OSA Severe on HST Followed by Dr Alva. Encouraged compliance with CPAP therapy.  3. HTN Stable, no changes today.  4. VHD Mild to moderate MR Followed by Dr McDowell.    Follow up in the AF clinic post DCCV.    Ricky Hever Castilleja PA-C Afib Clinic [facility] [address] [place], [place] [address] [phone] 08/15/2022 9:30 AM 

## 2022-08-18 ENCOUNTER — Encounter (HOSPITAL_COMMUNITY): Payer: Self-pay | Admitting: *Deleted

## 2022-08-18 ENCOUNTER — Encounter: Payer: Self-pay | Admitting: Cardiovascular Disease

## 2022-08-19 NOTE — Pre-Procedure Instructions (Signed)
Left voicemail regarding procedure (cardioversion) on Monday - arrive at 0800, NPO after 0000, ensure pt has a ride home and stay with pt for 24 hours after procedure, pills with a sip of water in the AM

## 2022-08-22 ENCOUNTER — Other Ambulatory Visit: Payer: Self-pay

## 2022-08-22 ENCOUNTER — Ambulatory Visit (HOSPITAL_COMMUNITY)
Admission: RE | Admit: 2022-08-22 | Discharge: 2022-08-22 | Disposition: A | Discharge status: Home / Self Care | Payer: Self-pay | Attending: Internal Medicine | Admitting: Internal Medicine

## 2022-08-22 ENCOUNTER — Ambulatory Visit (HOSPITAL_BASED_OUTPATIENT_CLINIC_OR_DEPARTMENT_OTHER): Payer: Self-pay | Admitting: Certified Registered"

## 2022-08-22 ENCOUNTER — Encounter (HOSPITAL_COMMUNITY): Payer: Self-pay | Admitting: Internal Medicine

## 2022-08-22 ENCOUNTER — Encounter (HOSPITAL_COMMUNITY)
Admission: RE | Disposition: A | Discharge status: Home / Self Care | Payer: Self-pay | Source: Home / Self Care | Attending: Internal Medicine

## 2022-08-22 ENCOUNTER — Ambulatory Visit (HOSPITAL_COMMUNITY): Payer: Self-pay | Admitting: Certified Registered"

## 2022-08-22 DIAGNOSIS — I4891 Unspecified atrial fibrillation: Secondary | ICD-10-CM

## 2022-08-22 DIAGNOSIS — G4730 Sleep apnea, unspecified: Secondary | ICD-10-CM

## 2022-08-22 DIAGNOSIS — J45909 Unspecified asthma, uncomplicated: Secondary | ICD-10-CM

## 2022-08-22 DIAGNOSIS — I10 Essential (primary) hypertension: Secondary | ICD-10-CM

## 2022-08-22 DIAGNOSIS — G4733 Obstructive sleep apnea (adult) (pediatric): Secondary | ICD-10-CM | POA: Insufficient documentation

## 2022-08-22 DIAGNOSIS — Z79899 Other long term (current) drug therapy: Secondary | ICD-10-CM | POA: Insufficient documentation

## 2022-08-22 DIAGNOSIS — I4819 Other persistent atrial fibrillation: Secondary | ICD-10-CM

## 2022-08-22 DIAGNOSIS — I4811 Longstanding persistent atrial fibrillation: Secondary | ICD-10-CM | POA: Insufficient documentation

## 2022-08-22 DIAGNOSIS — Z7901 Long term (current) use of anticoagulants: Secondary | ICD-10-CM | POA: Insufficient documentation

## 2022-08-22 HISTORY — DX: Anxiety disorder, unspecified: F41.9

## 2022-08-22 HISTORY — PX: CARDIOVERSION: SHX1299

## 2022-08-22 SURGERY — CARDIOVERSION
Anesthesia: General

## 2022-08-22 MED ORDER — SODIUM CHLORIDE 0.9 % IV SOLN: INTRAVENOUS | Status: DC

## 2022-08-22 MED ORDER — PROPOFOL 10 MG/ML IV BOLUS
INTRAVENOUS | Status: DC | PRN
  Administered 2022-08-22: 70 mg via INTRAVENOUS

## 2022-08-22 MED ORDER — ONDANSETRON HCL 4 MG/2ML IJ SOLN: 4.0000 mg | Freq: Once | INTRAMUSCULAR | Status: DC | PRN

## 2022-08-22 MED ORDER — SODIUM CHLORIDE 0.9 % IV SOLN: INTRAVENOUS | Status: DC | PRN

## 2022-08-22 MED ORDER — LIDOCAINE 2% (20 MG/ML) 5 ML SYRINGE
INTRAMUSCULAR | Status: DC | PRN
  Administered 2022-08-22: 100 mg via INTRAVENOUS

## 2022-08-22 SURGICAL SUPPLY — 1 items: ELECT DEFIB PAD ADLT CADENCE (PAD) ×1 IMPLANT

## 2022-08-22 NOTE — CV Procedure (Signed)
    Electrical Cardioversion Procedure Note Dana Ortiz [id number] 03-20-1971  Procedure: Electrical Cardioversion Indications:  Atrial Fibrillation  Time Out: Verified patient identification, verified procedure,medications/allergies/relevent history reviewed, required imaging and test results available.  Performed  Procedure Details  The patient was NPO after midnight. Anesthesia was administered at the beside  by [facility] with 70mg  of lidocaine and 100 mg propofol.  Cardioversion was done with synchronized biphasic defibrillation with AP pads with 200 Joules.  The patient quickly returned to atrial fibrillation. The patient tolerated the procedure well.  IMPRESSION:  Cardioversion of atrial fibrillation with return to atrial fibrillation   Riley Lam, MD FASE [facility] Cardiologist [facility]  [address] [place], [address] [place], [place] [address] [phone]  8:53 AM

## 2022-08-22 NOTE — Interval H&P Note (Signed)
History and Physical Interval Note:  08/22/2022 8:37 AM  Dana Ortiz  has presented today for surgery, with the diagnosis of AFIB.  The various methods of treatment have been discussed with the patient and family. After consideration of risks, benefits and other options for treatment, the patient has consented to  Procedure(s): CARDIOVERSION (N/A) as a surgical intervention.  The patient's history has been reviewed, patient examined, no change in status, stable for surgery.  I have reviewed the patient's chart and labs.  Questions were answered to the patient's satisfaction.     Felix Meras A Jocob Dambach

## 2022-08-22 NOTE — Anesthesia Postprocedure Evaluation (Signed)
Anesthesia Post Note  Patient: Dana Ortiz  Procedure(s) Performed: CARDIOVERSION     Patient location during evaluation: PACU Anesthesia Type: General Level of consciousness: awake and alert Pain management: pain level controlled Vital Signs Assessment: post-procedure vital signs reviewed and stable Respiratory status: spontaneous breathing, nonlabored ventilation, respiratory function stable and patient connected to nasal cannula oxygen Cardiovascular status: blood pressure returned to baseline and stable Postop Assessment: no apparent nausea or vomiting Anesthetic complications: no  No notable events documented.  Last Vitals:  Vitals:   08/22/22 0830 08/22/22 0905  BP: 109/72 116/72  Pulse: 85 76  Resp: 15 12  Temp:  36.6 C  SpO2: 98% 96%    Last Pain:  Vitals:   08/22/22 0905  TempSrc: Temporal                 Lylith Bebeau S

## 2022-08-22 NOTE — Transfer of Care (Signed)
Immediate Anesthesia Transfer of Care Note  Patient: Dana Ortiz  Procedure(s) Performed: CARDIOVERSION  Patient Location: PACU and Cath Lab  Anesthesia Type:General  Level of Consciousness: awake  Airway & Oxygen Therapy: Patient Spontanous Breathing and Patient connected to nasal cannula oxygen  Post-op Assessment: Report given to RN and Post -op Vital signs reviewed and stable  Post vital signs: Reviewed and stable  Last Vitals:  Vitals Value Taken Time  BP    Temp    Pulse    Resp    SpO2      Last Pain:  Vitals:   08/22/22 0804  TempSrc: Temporal         Complications: No notable events documented.

## 2022-08-22 NOTE — Anesthesia Preprocedure Evaluation (Signed)
Anesthesia Evaluation  Patient identified by MRN, date of birth, ID band Patient awake    Reviewed: Allergy & Precautions, H&P , NPO status , Patient's Chart, lab work & pertinent test results  Airway Mallampati: II  TM Distance: >3 FB Neck ROM: Full    Dental no notable dental hx.    Pulmonary asthma , sleep apnea    Pulmonary exam normal breath sounds clear to auscultation       Cardiovascular hypertension, + dysrhythmias Atrial Fibrillation + Valvular Problems/Murmurs MR  Rhythm:Irregular Rate:Normal + Systolic murmurs Left Ventricle: Left ventricular ejection fraction, by estimation, is 55  to 60%. The left ventricle has normal function. The left ventricular  internal cavity size was normal in size.   Right Ventricle: The right ventricular size is normal. Right vetricular  wall thickness was not assessed. Right ventricular systolic function is  normal.   Left Atrium: Left atrial size was moderately dilated. No left atrial/left  atrial appendage thrombus was detected.   Right Atrium: Right atrial size was mildly dilated.   Pericardium: There is no evidence of pericardial effusion.   Mitral Valve: The mitral valve is abnormal. 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 Moderate mitral valve  regurgitation.   Tricuspid Valve: The tricuspid valve is normal in structure. Tricuspid  valve regurgitation is mild.   Aortic Valve: The aortic valve is normal in structure. Aortic valve  regurgitation is not visualized.   Pulmonic Valve: The pulmonic valve was grossly normal. Pulmonic valve  regurgitation is trivial.   Aorta: The aortic root is normal in size and structure. There is mild  (Grade II) plaque involving the descending aorta.   IAS/Shunts: No atrial level shunt detected by color flow Doppler.   Additional Comments: Probable prolapse of posterior MV leaflet (P3);  however MR is  moderate and appears central.         Neuro/Psych negative neurological ROS  negative psych ROS   GI/Hepatic negative GI ROS, Neg liver ROS,,,  Endo/Other  negative endocrine ROS    Renal/GU negative Renal ROS  negative genitourinary   Musculoskeletal negative musculoskeletal ROS (+)    Abdominal   Peds negative pediatric ROS (+)  Hematology negative hematology ROS (+)   Anesthesia Other Findings   Reproductive/Obstetrics negative OB ROS                             Anesthesia Physical Anesthesia Plan  ASA: 3  Anesthesia Plan: General   Post-op Pain Management: Minimal or no pain anticipated   Induction: Intravenous  PONV Risk Score and Plan: Treatment may vary due to age or medical condition  Airway Management Planned: Mask  Additional Equipment:   Intra-op Plan:   Post-operative Plan:   Informed Consent: I have reviewed the patients History and Physical, chart, labs and discussed the procedure including the risks, benefits and alternatives for the proposed anesthesia with the patient or authorized representative who has indicated his/her understanding and acceptance.     Dental advisory given  Plan Discussed with: CRNA and Surgeon  Anesthesia Plan Comments:        Anesthesia Quick Evaluation

## 2022-08-24 ENCOUNTER — Encounter: Payer: Self-pay | Admitting: Cardiovascular Disease

## 2022-08-30 ENCOUNTER — Inpatient Hospital Stay (HOSPITAL_COMMUNITY)
Admission: RE | Admit: 2022-08-30 | Payer: PRIVATE HEALTH INSURANCE | Source: Ambulatory Visit | Admitting: Physician Assistant

## 2022-08-30 NOTE — Progress Notes (Signed)
Primary Care Physician: Rebecka Apley, NP Primary Cardiologist: Dr Diona Browner Primary Electrophysiologist: Dr Nelly Laurence  Referring Physician: Dr Alvan Dame Dana Ortiz is a 52 y.o. female with a history of HTN, OSA, and atrial fibrillation who presents for follow up in the [facility]. Patient is on Xarelto for a CHADS2VASC score of 2. She was seen by Dr Diona Browner on 05/12/20 and was believed to be in persistent atrial fibrillation for several months at least, if not longer, per the patient's Apple Watch. She does have symptoms of fatigue and dyspnea with activity while in afib. She has been intolerant of metoprolol and diltiazem in the past due to side effects. She denies significant alcohol use. Patient is s/p DCCV on 07/06/20 with quick return of her afib just under two days later. She does report she felt "great" for those two days which much more energy. Patient is s/p afib ablation 09/12/20.   Patient reported on 06/25/22 after work she had symptoms of palpitations, SOB, dizziness, and chest heaviness. Her smart watch showed afib. This is her first episode since her ablation in 2022. She deferred DCCV until she could get started on CPAP for her severe OSA.  On follow up today, patient is s/p DCCV on 08/22/22 but quickly returned to afib. She continues to feel very fatigued. No bleeding issues on anticoagulation.   Today, she denies symptoms of chest pain, SOB, orthopnea, PND, lower extremity edema, presyncope, syncope, bleeding, or neurologic sequela. The patient is tolerating medications without difficulties and is otherwise without complaint today.    Atrial Fibrillation Risk Factors:  she does have symptoms or diagnosis of sleep apnea. She is compliant with CPAP therapy.  she does not have a history of rheumatic fever. she does not have a history of alcohol use. The patient does not have a history of early familial atrial fibrillation or other  arrhythmias.  she has a BMI of Body mass index is 29.25 kg/m.Marland Kitchen Filed Weights   08/31/22 0911  Weight: 87.3 kg    Family History  Problem Relation Age of Onset   Diabetes Mother    Glaucoma Mother    Cancer - Ovarian Mother    Kidney cancer Father    Heart Problems Brother      Atrial Fibrillation Management history:  Previous antiarrhythmic drugs: flecainide  Previous cardioversions: 07/06/20 Previous ablations: 09/12/20 CHADS2VASC score: 2 Anticoagulation history: Xarelto   Past Medical History:  Diagnosis Date   ADHD (attention deficit hyperactivity disorder)    Anxiety    Asthma    Essential hypertension    Paroxysmal atrial fibrillation (HCC)    TMJ arthralgia    Past Surgical History:  Procedure Laterality Date   ATRIAL FIBRILLATION ABLATION N/A 09/11/2020   Procedure: ATRIAL FIBRILLATION ABLATION;  Surgeon: Hillis Range, MD;  Location: [facility] INVASIVE CV LAB;  Service: Cardiovascular;  Laterality: N/A;   CARDIOVERSION N/A 07/06/2020   Procedure: CARDIOVERSION;  Surgeon: Laurey Morale, MD;  Location: [facility] ENDOSCOPY;  Service: Cardiovascular;  Laterality: N/A;   CARDIOVERSION N/A 08/22/2022   Procedure: CARDIOVERSION;  Surgeon: Christell Constant, MD;  Location: [facility] INVASIVE CV LAB;  Service: Cardiovascular;  Laterality: N/A;   TEE WITHOUT CARDIOVERSION N/A 09/10/2020   Procedure: TRANSESOPHAGEAL ECHOCARDIOGRAM (TEE);  Surgeon: Lewayne Bunting, MD;  Location: [facility] ENDOSCOPY;  Service: Cardiovascular;  Laterality: N/A;   TONSILLECTOMY     TUBAL LIGATION      Current Outpatient Medications  Medication Sig Dispense Refill  albuterol (VENTOLIN HFA) 108 (90 Base) MCG/ACT inhaler Inhale 2 puffs into the lungs every 6 (six) hours as needed for wheezing or shortness of breath.     amiodarone (PACERONE) 200 MG tablet Take 1 tablet by mouth twice a day for 1 month then reduce to 1 tablet daily 60 tablet 0   amphetamine-dextroamphetamine (ADDERALL) 20 MG tablet Take 20  mg by mouth 3 (three) times daily.     Azelastine HCl 137 MCG/SPRAY SOLN Place 1 spray into both nostrils as needed.     bisoprolol (ZEBETA) 5 MG tablet TAKE 1/2 TABLET BY MOUTH DAILY (cut in half) (Patient taking differently: Taking 1 tablet by mouth daily) 45 tablet 3   celecoxib (CELEBREX) 200 MG capsule Take 200 mg by mouth as needed.     citalopram (CELEXA) 20 MG tablet Take 20 mg by mouth at bedtime.     losartan (COZAAR) 50 MG tablet Take 50 mg by mouth daily.     rivaroxaban (XARELTO) 20 MG TABS tablet TAKE 1 TABLET BY MOUTH DAILY WITH SUPPER 90 tablet 3   SYMBICORT 80-4.5 MCG/ACT inhaler Inhale 2 puffs into the lungs daily as needed (seasonal allergies).     No current facility-administered medications for this encounter.    Allergies  Allergen Reactions   Morphine And Codeine Anaphylaxis    Social History   Socioeconomic History   Marital status: Widowed    Spouse name: Not on file   Number of children: Not on file   Years of education: Not on file   Highest education level: Not on file  Occupational History   Not on file  Tobacco Use   Smoking status: Never   Smokeless tobacco: Never   Tobacco comments:    Never smoke 06/27/22  Substance and Sexual Activity   Alcohol use: Yes    Alcohol/week: 7.0 standard drinks of alcohol    Types: 7 Glasses of wine per week    Comment: one glass of wine weekly 08/15/22   Drug use: No   Sexual activity: Not on file  Other Topics Concern   Not on file  Social History Narrative   Lives with husband, no children, 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  Physical Exam: Vitals:   08/31/22 0911   BP: 106/66  Pulse: (!) 109  Weight: 87.3 kg  Height: 5\' 8"  (1.727 m)    GEN- The patient is a well appearing female, alert and oriented x 3 today.   HEENT-head normocephalic, atraumatic, sclera clear, conjunctiva pink, hearing intact, trachea midline. Lungs- Clear to ausculation bilaterally, normal work of breathing Heart- irregular rate and rhythm, no murmurs, rubs or gallops  GI- soft, NT, ND, + BS Extremities- no clubbing, cyanosis, or edema MS- no significant deformity or atrophy Skin- no rash or lesion Psych- euthymic mood, full affect Neuro- strength and sensation are intact   Wt Readings from Last 3 Encounters:  08/31/22 87.3 kg  08/22/22 85.3 kg  08/15/22 85.5 kg    EKG today demonstrates  Coarse afib Vent. rate 109 BPM PR interval * ms QRS duration 84 ms QT/QTcB 334/449 ms  Echo  07/01/20 demonstrated  1. Left ventricular ejection fraction, by estimation, is approximately  50%. The left ventricle has low normal function. The left ventricle  demonstrates global hypokinesis. There is mild left ventricular  hypertrophy. Left ventricular diastolic parameters are indeterminate.   2. Right ventricular systolic function is normal. The right ventricular  size is normal. There is normal pulmonary artery systolic pressure. The  estimated right ventricular systolic pressure is 22.0 mmHg.   3. Left atrial size was moderately to severely dilated.   4. Right atrial size was upper normal.   5. There is a trivial pericardial effusion posterior to the left  ventricle.   6. The mitral valve is grossly normal. Mild to moderate mitral valve  regurgitation made up of two jets.   7. The aortic valve is tricuspid. Aortic valve regurgitation is not  visualized.   8. The inferior vena cava is normal in size with greater than 50%  respiratory variability, suggesting right atrial pressure of 3 mmHg.   9. Cannot exclude small left to right interatrial shunt, possible PFO.    Comparison(s): Echocardiogram done 09/29/16 showed an EF of 55-60%.   Epic records are reviewed at length today  CHA2DS2-VASc Score = 2  The patient's score is based upon: CHF History: 0 HTN History: 1 Diabetes History: 0 Stroke History: 0 Vascular Disease History: 0 Age Score: 0 Gender Score: 1       ASSESSMENT AND PLAN: 1. Longstanding Persistent Atrial Fibrillation (ICD10:  I48.11) The patient's CHA2DS2-VASc score is 2, indicating a 2.2% annual risk of stroke.   Previously failed flecainide. S/p afib ablation 09/12/20 S/p DCCV 08/22/22 which was unsuccessful.  We discussed rhythm control options today. She would like to pursue repeat ablation with Dr Nelly Laurence. In the short term, we discussed using AAD as a bridge to ablation. Previously failed flecainide. We discussed dofetilide and amiodarone. She does not want to change her citalopram (interacts with dofetilide) and does not want to be admitted for loading. Will start amiodarone as a bridge to ablation. Start amiodarone 200 mg BID for 4 weeks then decrease to once daily. Patient to return for ECG in two weeks. Check cmet/TSH/cbc today.  We did discuss the possibility that she may need AAD after ablation to stay in SR and we may need to consider dofetilide to avoid the long term off target effects of amiodarone.  Continue bisoprolol 5 mg daily for now Continue Xarelto 20 mg daily    2. OSA Severe on HST Followed by Dr Vassie Loll. Encouraged compliance with CPAP therapy.  3. HTN Stable, no changes today.  4. VHD Mild to moderate MR Followed by Dr Diona Browner.    Follow up in the AF clinic in two weeks.    Dana Loa PA-C [facility] [address] [place], [place] [address] [phone] 08/31/2022 10:00 AM

## 2022-08-31 ENCOUNTER — Ambulatory Visit (HOSPITAL_COMMUNITY)
Admission: RE | Admit: 2022-08-31 | Discharge: 2022-08-31 | Disposition: A | Discharge status: Home / Self Care | Payer: PRIVATE HEALTH INSURANCE | Source: Ambulatory Visit | Attending: Physician Assistant | Admitting: Physician Assistant

## 2022-08-31 VITALS — BP 106/66 | HR 109 | Ht 68.0 in | Wt 192.4 lb

## 2022-08-31 DIAGNOSIS — G4733 Obstructive sleep apnea (adult) (pediatric): Secondary | ICD-10-CM | POA: Insufficient documentation

## 2022-08-31 DIAGNOSIS — I4811 Longstanding persistent atrial fibrillation: Secondary | ICD-10-CM | POA: Diagnosis not present

## 2022-08-31 DIAGNOSIS — I10 Essential (primary) hypertension: Secondary | ICD-10-CM | POA: Diagnosis not present

## 2022-08-31 LAB — COMPREHENSIVE METABOLIC PANEL
ALT: 37 U/L (ref 0–44)
AST: 26 U/L (ref 15–41)
Albumin: 3.8 g/dL (ref 3.5–5.0)
Alkaline Phosphatase: 63 U/L (ref 38–126)
Anion gap: 10 (ref 5–15)
BUN: 18 mg/dL (ref 6–20)
CO2: 26 mmol/L (ref 22–32)
Calcium: 9.2 mg/dL (ref 8.9–10.3)
Chloride: 103 mmol/L (ref 98–111)
Creatinine, Ser: 1.12 mg/dL — ABNORMAL HIGH (ref 0.44–1.00)
GFR, Estimated: 59 mL/min — ABNORMAL LOW (ref >60)
Glucose, Bld: 90 mg/dL (ref 70–99)
Potassium: 3.7 mmol/L (ref 3.5–5.1)
Sodium: 139 mmol/L (ref 135–145)
Total Bilirubin: 0.4 mg/dL (ref 0.3–1.2)
Total Protein: 6.3 g/dL — ABNORMAL LOW (ref 6.5–8.1)

## 2022-08-31 LAB — CBC
HCT: 40.5 % (ref 36.0–46.0)
Hemoglobin: 13.3 g/dL (ref 12.0–15.0)
MCH: 28.8 pg (ref 26.0–34.0)
MCHC: 32.8 g/dL (ref 30.0–36.0)
MCV: 87.7 fL (ref 80.0–100.0)
Platelets: 278 10*3/uL (ref 150–400)
RBC: 4.62 MIL/uL (ref 3.87–5.11)
RDW: 12.7 % (ref 11.5–15.5)
WBC: 8.2 10*3/uL (ref 4.0–10.5)
nRBC: 0 % (ref 0.0–0.2)

## 2022-08-31 LAB — TSH: TSH: 2.259 u[IU]/mL (ref 0.350–4.500)

## 2022-08-31 MED ORDER — AMIODARONE HCL 200 MG PO TABS: ORAL_TABLET | ORAL | 0 refills | Status: DC

## 2022-08-31 NOTE — Patient Instructions (Signed)
Start Amiodarone 200mg  -- take 1 tablet twice a day for 1 month then daily take with food

## 2022-09-01 ENCOUNTER — Ambulatory Visit (HOSPITAL_COMMUNITY): Payer: Medicaid Other | Admitting: Physician Assistant

## 2022-09-01 ENCOUNTER — Ambulatory Visit: Payer: PRIVATE HEALTH INSURANCE | Admitting: Pulmonary Disease

## 2022-09-01 ENCOUNTER — Encounter: Payer: Self-pay | Admitting: Pulmonary Disease

## 2022-09-01 VITALS — BP 105/71 | HR 99 | Ht 68.0 in | Wt 193.0 lb

## 2022-09-01 DIAGNOSIS — G4733 Obstructive sleep apnea (adult) (pediatric): Secondary | ICD-10-CM

## 2022-09-01 DIAGNOSIS — I4811 Longstanding persistent atrial fibrillation: Secondary | ICD-10-CM | POA: Diagnosis not present

## 2022-09-01 NOTE — Assessment & Plan Note (Addendum)
Reviewed CPAP download.  Average pressure is 10 with a maximum pressure of 11 cm.  Residual AHI is 9/hour on average but on certain nights going as high as 20/hour.  She has a few central events 6/hour. We will change her to auto CPAP 8 to 13 cm.  Her average usage is 3.5 hours. Will provide her with nasal pillows mask.  10 and see if we can improve her compliance to the expected more than 4 hours.  I also offered her a mask fitting session appointment should the nasal pillows not work.  I am hopeful that we can get her comfortable on the machine.  She has noticed some improvement in daytime somnolence and this is something to aim for.  I am concerned about some emergence of central apneas but we will see where this falls. If above measures do not work we can consider formal titration study Finally if she is unable to tolerate CPAP at all we can consider hypoglossal nerve stimulator implant

## 2022-09-01 NOTE — Assessment & Plan Note (Signed)
We discussed benefits of CPAP therapy on control of atrial fibrillation. She has been started on amiodarone

## 2022-09-01 NOTE — Patient Instructions (Addendum)
X Trial of nasal pillows P10 for her  Call [phone] for mask fitting session if needed  X Change to autoCPAP 8-13

## 2022-09-01 NOTE — Progress Notes (Signed)
   Subjective:    Patient ID: Dana Ortiz, female    DOB: 1970/07/02, 52 y.o.   MRN: [id number]  HPI  52 yo woman, never smoker for FU of OSA.   Past medical history : PAF on Xarelto,failed ablation, s/pDCCV  asthma, on Adderall for ADHD , TMJ.   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 We initiated auto CPAP 5 to 15 cm last visit with nasal cradle mask.  She has tried this for more than a month now and is slowly settling down with this.  Some nights she is able to use it up to 6 hours other nights of and she has a lot of leakage.  She would like to trial alternative mask.  Overall she feels refreshed on nights she uses the machine.  She can also tell from her watch that her episodes of atrial fibrillation are lesser when she uses her CPAP. Reviewed cardiology consultation, unfortunately DCCV did not work she is back into atrial fibrillation.  Amiodarone has been initiated   Significant tests/ events reviewed     04/2022 HST >> AHI 36/h, low sat 70% 05/2020 CTA chest: b/l multifocal areas of soft tissue and GGO nodularity, mosaic attenuation pattern 07/01/2020 echo: EF 50%, mild LVH, RVSP 22 mmHg, mod/severe LA dilation   Past Medical History:  Diagnosis Date   ADHD (attention deficit hyperactivity disorder)    Anxiety    Asthma    Essential hypertension    Paroxysmal atrial fibrillation (HCC)    TMJ arthralgia     Review of Systems neg for any significant sore throat, dysphagia, itching, sneezing, nasal congestion or excess/ purulent secretions, fever, chills, sweats, unintended wt loss, pleuritic or exertional cp, hempoptysis, orthopnea pnd or change in chronic leg  swelling. Also denies presyncope, palpitations, heartburn, abdominal pain, nausea, vomiting, diarrhea or change in bowel or urinary habits, dysuria,hematuria, rash, arthralgias, visual complaints, headache, numbness weakness or ataxia.     Objective:   Physical Exam  Gen. Pleasant, well-nourished, in no distress ENT - no thrush, no pallor/icterus,no post nasal drip, normal bite Neck: No JVD, no thyromegaly, no carotid bruits Lungs: no use of accessory muscles, no dullness to percussion, clear without rales or rhonchi  Cardiovascular: Rhythm regular, heart sounds  normal, no murmurs or gallops, no peripheral edema Musculoskeletal: No deformities, no cyanosis or clubbing        Assessment & Plan:

## 2022-09-07 ENCOUNTER — Telehealth: Payer: Self-pay

## 2022-09-07 NOTE — Telephone Encounter (Signed)
LM for pt to call back to schedule AF Ablation. 

## 2022-09-15 ENCOUNTER — Ambulatory Visit (HOSPITAL_COMMUNITY)
Admission: RE | Admit: 2022-09-15 | Discharge: 2022-09-15 | Disposition: A | Discharge status: Home / Self Care | Payer: Self-pay | Source: Ambulatory Visit | Attending: Physician Assistant | Admitting: Physician Assistant

## 2022-09-15 ENCOUNTER — Other Ambulatory Visit: Payer: PRIVATE HEALTH INSURANCE

## 2022-09-15 ENCOUNTER — Telehealth: Payer: Self-pay

## 2022-09-15 VITALS — HR 93

## 2022-09-15 DIAGNOSIS — Z79899 Other long term (current) drug therapy: Secondary | ICD-10-CM | POA: Insufficient documentation

## 2022-09-15 DIAGNOSIS — I4819 Other persistent atrial fibrillation: Secondary | ICD-10-CM | POA: Insufficient documentation

## 2022-09-15 NOTE — Telephone Encounter (Signed)
Pt is scheduled for an Afib Ablation with Dr. Nelly Laurence on 6/19...  Labs - 5/30  She will pick up Instruction letter when she comes in for labs.

## 2022-09-15 NOTE — Patient Instructions (Addendum)
Decrease Amiodarone to 200mg  once  a day on 6/15   Cardioversion scheduled for: Friday, June 7th   - Arrive at the [facility] and go to admitting at [facility] not eat or drink anything after midnight the night prior to your procedure.   - Take all your morning medication (except diabetic medications) with a sip of water prior to arrival.  - You will not be able to drive home after your procedure.    - Do NOT miss any doses of your blood thinner - if you should miss a dose please notify our office immediately.   - If you feel as if you go back into normal rhythm prior to scheduled cardioversion, please notify our office immediately.   If your procedure is canceled in the cardioversion suite you will be charged a cancellation fee.

## 2022-09-15 NOTE — Progress Notes (Signed)
Patient returns for ECG after starting amiodarone. ECG shows:  Coarse afib Vent. rate 93 BPM PR interval * ms QRS duration 82 ms QT/QTcB 378/469 ms  Will proceed with scheduling DCCV. Follow up in the AF clinic post DCCV.

## 2022-09-23 ENCOUNTER — Encounter (HOSPITAL_COMMUNITY): Admission: RE | Payer: Self-pay | Source: Home / Self Care

## 2022-09-23 ENCOUNTER — Ambulatory Visit (HOSPITAL_COMMUNITY)
Admission: RE | Admit: 2022-09-23 | Payer: PRIVATE HEALTH INSURANCE | Source: Home / Self Care | Admitting: Cardiovascular Disease

## 2022-09-23 SURGERY — CARDIOVERSION
Anesthesia: Monitor Anesthesia Care

## 2022-09-27 ENCOUNTER — Telehealth (HOSPITAL_COMMUNITY): Payer: Self-pay | Admitting: Emergency Medicine

## 2022-09-27 NOTE — Telephone Encounter (Signed)
Attempted to call patient regarding upcoming cardiac CT appointment. °Left message on voicemail with name and callback number °Jihad Brownlow RN Navigator Cardiac Imaging °[facility] °[phone] Office °[phone] Cell ° °

## 2022-09-28 ENCOUNTER — Ambulatory Visit (HOSPITAL_COMMUNITY)
Admission: RE | Admit: 2022-09-28 | Discharge: 2022-09-28 | Disposition: A | Discharge status: Home / Self Care | Payer: Self-pay | Source: Ambulatory Visit | Attending: Cardiovascular Disease | Admitting: Cardiovascular Disease

## 2022-09-28 DIAGNOSIS — I4819 Other persistent atrial fibrillation: Secondary | ICD-10-CM | POA: Insufficient documentation

## 2022-09-28 MED ORDER — IOHEXOL 350 MG/ML SOLN
100.0000 mL | Freq: Once | INTRAVENOUS | Status: AC | PRN
  Administered 2022-09-28: 100 mL via INTRAVENOUS

## 2022-10-03 ENCOUNTER — Other Ambulatory Visit (HOSPITAL_COMMUNITY)
Admission: RE | Admit: 2022-10-03 | Discharge: 2022-10-03 | Disposition: A | Discharge status: Home / Self Care | Payer: Medicaid Other | Source: Ambulatory Visit | Attending: Cardiovascular Disease | Admitting: Cardiovascular Disease

## 2022-10-03 ENCOUNTER — Telehealth: Payer: Self-pay

## 2022-10-03 DIAGNOSIS — I4819 Other persistent atrial fibrillation: Secondary | ICD-10-CM | POA: Insufficient documentation

## 2022-10-03 LAB — CBC
HCT: 42.1 % (ref 36.0–46.0)
Hemoglobin: 14.2 g/dL (ref 12.0–15.0)
MCH: 29 pg (ref 26.0–34.0)
MCHC: 33.7 g/dL (ref 30.0–36.0)
MCV: 86.1 fL (ref 80.0–100.0)
Platelets: 275 10*3/uL (ref 150–400)
RBC: 4.89 MIL/uL (ref 3.87–5.11)
RDW: 12.8 % (ref 11.5–15.5)
WBC: 8.9 10*3/uL (ref 4.0–10.5)
nRBC: 0 % (ref 0.0–0.2)

## 2022-10-03 LAB — BASIC METABOLIC PANEL
Anion gap: 9 (ref 5–15)
BUN: 37 mg/dL — ABNORMAL HIGH (ref 6–20)
CO2: 28 mmol/L (ref 22–32)
Calcium: 9.3 mg/dL (ref 8.9–10.3)
Chloride: 101 mmol/L (ref 98–111)
Creatinine, Ser: 1.07 mg/dL — ABNORMAL HIGH (ref 0.44–1.00)
GFR, Estimated: >60 mL/min (ref >60)
Glucose, Bld: 139 mg/dL — ABNORMAL HIGH (ref 70–99)
Potassium: 4 mmol/L (ref 3.5–5.1)
Sodium: 138 mmol/L (ref 135–145)

## 2022-10-03 NOTE — Telephone Encounter (Signed)
Pt's "significant other" is aware of her procedure time. She will arrive at 830 for a 1030 procedure time.

## 2022-10-04 NOTE — Pre-Procedure Instructions (Signed)
Attempted to call patient regarding procedure instructions.  Left voicemail on the  following items: Arrival time 0830 Nothing to eat or drink after midnight No meds AM of procedure Responsible person to drive you home and stay with you for 24 hrs  Have you missed any doses of anti-coagulant Xarelto- if you have missed any doses please let office know

## 2022-10-05 ENCOUNTER — Ambulatory Visit (HOSPITAL_BASED_OUTPATIENT_CLINIC_OR_DEPARTMENT_OTHER): Payer: Self-pay | Admitting: Anesthesiology

## 2022-10-05 ENCOUNTER — Ambulatory Visit (HOSPITAL_COMMUNITY): Payer: Self-pay | Admitting: Anesthesiology

## 2022-10-05 ENCOUNTER — Other Ambulatory Visit: Payer: Self-pay

## 2022-10-05 ENCOUNTER — Ambulatory Visit (HOSPITAL_COMMUNITY)
Admission: RE | Disposition: A | Discharge status: Home / Self Care | Payer: Self-pay | Source: Home / Self Care | Attending: Cardiovascular Disease

## 2022-10-05 ENCOUNTER — Ambulatory Visit (HOSPITAL_COMMUNITY)
Admission: RE | Admit: 2022-10-05 | Discharge: 2022-10-06 | Disposition: A | Discharge status: Home / Self Care | Payer: Self-pay | Attending: Cardiovascular Disease | Admitting: Cardiovascular Disease

## 2022-10-05 DIAGNOSIS — I10 Essential (primary) hypertension: Secondary | ICD-10-CM

## 2022-10-05 DIAGNOSIS — J45909 Unspecified asthma, uncomplicated: Secondary | ICD-10-CM

## 2022-10-05 DIAGNOSIS — Z049 Encounter for examination and observation for unspecified reason: Secondary | ICD-10-CM

## 2022-10-05 DIAGNOSIS — I4819 Other persistent atrial fibrillation: Secondary | ICD-10-CM | POA: Diagnosis present

## 2022-10-05 DIAGNOSIS — I4891 Unspecified atrial fibrillation: Secondary | ICD-10-CM

## 2022-10-05 DIAGNOSIS — Z7901 Long term (current) use of anticoagulants: Secondary | ICD-10-CM | POA: Insufficient documentation

## 2022-10-05 DIAGNOSIS — G4730 Sleep apnea, unspecified: Secondary | ICD-10-CM

## 2022-10-05 DIAGNOSIS — R0683 Snoring: Secondary | ICD-10-CM | POA: Insufficient documentation

## 2022-10-05 HISTORY — PX: ATRIAL FIBRILLATION ABLATION: EP1191

## 2022-10-05 LAB — POCT ACTIVATED CLOTTING TIME: Activated Clotting Time: 342 seconds

## 2022-10-05 SURGERY — ATRIAL FIBRILLATION ABLATION
Anesthesia: General

## 2022-10-05 MED ORDER — HEPARIN SODIUM (PORCINE) 1000 UNIT/ML IJ SOLN
INTRAMUSCULAR | Status: DC | PRN
  Administered 2022-10-05: 1000 [IU] via INTRAVENOUS

## 2022-10-05 MED ORDER — PROPOFOL 10 MG/ML IV BOLUS
INTRAVENOUS | Status: DC | PRN
  Administered 2022-10-05: 140 mg via INTRAVENOUS

## 2022-10-05 MED ORDER — HEPARIN SODIUM (PORCINE) 1000 UNIT/ML IJ SOLN
INTRAMUSCULAR | Status: DC | PRN
  Administered 2022-10-05: 15000 [IU] via INTRAVENOUS

## 2022-10-05 MED ORDER — ROCURONIUM BROMIDE 10 MG/ML (PF) SYRINGE
PREFILLED_SYRINGE | INTRAVENOUS | Status: DC | PRN
  Administered 2022-10-05: 50 mg via INTRAVENOUS
  Administered 2022-10-05: 30 mg via INTRAVENOUS

## 2022-10-05 MED ORDER — SODIUM CHLORIDE 0.9 % IV SOLN: 250.0000 mL | INTRAVENOUS | Status: DC | PRN

## 2022-10-05 MED ORDER — ACETAMINOPHEN 500 MG PO TABS
1000.0000 mg | ORAL_TABLET | Freq: Once | ORAL | Status: AC
  Administered 2022-10-05: 1000 mg via ORAL
  Filled 2022-10-05: qty 2

## 2022-10-05 MED ORDER — AMIODARONE HCL 200 MG PO TABS
200.0000 mg | ORAL_TABLET | Freq: Every day | ORAL | Status: DC
  Administered 2022-10-05: 200 mg via ORAL
  Filled 2022-10-05 (×2): qty 1

## 2022-10-05 MED ORDER — PHENYLEPHRINE HCL-NACL 20-0.9 MG/250ML-% IV SOLN
INTRAVENOUS | Status: DC | PRN
  Administered 2022-10-05: 50 ug/min via INTRAVENOUS
  Administered 2022-10-05 (×2): 80 ug via INTRAVENOUS

## 2022-10-05 MED ORDER — SODIUM CHLORIDE 0.9 % IV BOLUS
500.0000 mL | Freq: Once | INTRAVENOUS | Status: AC
  Administered 2022-10-05: 500 mL via INTRAVENOUS

## 2022-10-05 MED ORDER — MIDAZOLAM HCL 5 MG/5ML IJ SOLN
INTRAMUSCULAR | Status: AC
  Filled 2022-10-05: qty 5

## 2022-10-05 MED ORDER — SODIUM CHLORIDE 0.9% FLUSH
3.0000 mL | Freq: Two times a day (BID) | INTRAVENOUS | Status: DC
  Administered 2022-10-05: 3 mL via INTRAVENOUS

## 2022-10-05 MED ORDER — SODIUM CHLORIDE 0.9 % IV SOLN: INTRAVENOUS | Status: DC

## 2022-10-05 MED ORDER — PROTAMINE SULFATE 10 MG/ML IV SOLN
INTRAVENOUS | Status: DC | PRN
  Administered 2022-10-05: 10 mg via INTRAVENOUS
  Administered 2022-10-05: 40 mg via INTRAVENOUS

## 2022-10-05 MED ORDER — DEXAMETHASONE SODIUM PHOSPHATE 10 MG/ML IJ SOLN
INTRAMUSCULAR | Status: DC | PRN
  Administered 2022-10-05: 10 mg via INTRAVENOUS

## 2022-10-05 MED ORDER — FENTANYL CITRATE (PF) 100 MCG/2ML IJ SOLN
INTRAMUSCULAR | Status: AC
  Filled 2022-10-05: qty 2

## 2022-10-05 MED ORDER — CELECOXIB 200 MG PO CAPS: 200.0000 mg | ORAL_CAPSULE | ORAL | Status: DC | PRN

## 2022-10-05 MED ORDER — HEPARIN SODIUM (PORCINE) 1000 UNIT/ML IJ SOLN
INTRAMUSCULAR | Status: AC
  Filled 2022-10-05: qty 10

## 2022-10-05 MED ORDER — FENTANYL CITRATE (PF) 250 MCG/5ML IJ SOLN
INTRAMUSCULAR | Status: DC | PRN
  Administered 2022-10-05 (×2): 50 ug via INTRAVENOUS

## 2022-10-05 MED ORDER — HEPARIN (PORCINE) IN NACL 1000-0.9 UT/500ML-% IV SOLN
INTRAVENOUS | Status: DC | PRN
  Administered 2022-10-05 (×4): 500 mL

## 2022-10-05 MED ORDER — CITALOPRAM HYDROBROMIDE 20 MG PO TABS: 20.0000 mg | ORAL_TABLET | Freq: Every day | ORAL | Status: DC

## 2022-10-05 MED ORDER — RIVAROXABAN 20 MG PO TABS: 20.0000 mg | ORAL_TABLET | Freq: Every day | ORAL | Status: DC

## 2022-10-05 MED ORDER — ALBUTEROL SULFATE HFA 108 (90 BASE) MCG/ACT IN AERS: 2.0000 | INHALATION_SPRAY | Freq: Four times a day (QID) | RESPIRATORY_TRACT | Status: DC | PRN

## 2022-10-05 MED ORDER — ONDANSETRON HCL 4 MG/2ML IJ SOLN
INTRAMUSCULAR | Status: DC | PRN
  Administered 2022-10-05: 4 mg via INTRAVENOUS

## 2022-10-05 MED ORDER — ONDANSETRON HCL 4 MG/2ML IJ SOLN: 4.0000 mg | Freq: Four times a day (QID) | INTRAMUSCULAR | Status: DC | PRN

## 2022-10-05 MED ORDER — ALBUTEROL SULFATE (2.5 MG/3ML) 0.083% IN NEBU: 2.5000 mg | INHALATION_SOLUTION | Freq: Four times a day (QID) | RESPIRATORY_TRACT | Status: DC | PRN

## 2022-10-05 MED ORDER — SODIUM CHLORIDE 0.9% FLUSH: 3.0000 mL | INTRAVENOUS | Status: DC | PRN

## 2022-10-05 MED ORDER — AMPHETAMINE-DEXTROAMPHETAMINE 10 MG PO TABS
20.0000 mg | ORAL_TABLET | Freq: Three times a day (TID) | ORAL | Status: DC
  Administered 2022-10-05 (×2): 20 mg via ORAL
  Filled 2022-10-05 (×3): qty 2

## 2022-10-05 MED ORDER — LIDOCAINE 2% (20 MG/ML) 5 ML SYRINGE
INTRAMUSCULAR | Status: DC | PRN
  Administered 2022-10-05: 80 mg via INTRAVENOUS

## 2022-10-05 MED ORDER — ACETAMINOPHEN 325 MG PO TABS
650.0000 mg | ORAL_TABLET | ORAL | Status: DC | PRN
  Administered 2022-10-05 – 2022-10-06 (×2): 650 mg via ORAL
  Filled 2022-10-05 (×2): qty 2

## 2022-10-05 SURGICAL SUPPLY — 19 items
BLANKET WARM UNDERBOD FULL ACC (MISCELLANEOUS) ×1 IMPLANT
CATH ABLAT QDOT MICRO BI TC DF (CATHETERS) IMPLANT
CATH OCTARAY 2.0 F 3-3-3-3-3 (CATHETERS) IMPLANT
CATH PIGTAIL STEERABLE D1 8.7 (WIRE) IMPLANT
CATH S-M CIRCA TEMP PROBE (CATHETERS) IMPLANT
CATH SOUNDSTAR ECO 8FR (CATHETERS) IMPLANT
CATH WEBSTER BI DIR CS D-F CRV (CATHETERS) IMPLANT
CLOSURE PERCLOSE PROSTYLE (VASCULAR PRODUCTS) IMPLANT
COVER SWIFTLINK CONNECTOR (BAG) ×1 IMPLANT
DEVICE CLOSURE MYNXGRIP 6/7F (Vascular Products) IMPLANT
PACK EP LATEX FREE (CUSTOM PROCEDURE TRAY) ×1
PACK EP LF (CUSTOM PROCEDURE TRAY) ×1 IMPLANT
PAD DEFIB RADIO PHYSIO CONN (PAD) ×1 IMPLANT
PATCH CARTO3 (PAD) IMPLANT
SHEATH CARTO VIZIGO MED CURVE (SHEATH) IMPLANT
SHEATH PINNACLE 8F 10CM (SHEATH) IMPLANT
SHEATH PINNACLE 9F 10CM (SHEATH) IMPLANT
SHEATH PROBE COVER 6X72 (BAG) IMPLANT
TUBING SMART ABLATE COOLFLOW (TUBING) IMPLANT

## 2022-10-05 NOTE — H&P (Addendum)
PCP: Rebecka Apley, NP Primary Cardiologist: Dr Diona Browner Primary EP: Dr Nelly Laurence  Dana Ortiz is a 52 y.o. female who presents today for routine electrophysiology followup.  Since last being seen in our clinic, the patient reports doing very well.  Today, she denies symptoms of palpitations, chest pain, shortness of breath,  lower extremity edema, dizziness, presyncope, or syncope.  The patient is otherwise without complaint today.   I last saw her in clinic months ago 05/06/2022. At that time, she had not had any recurrence of AF since her ablation. In the interim, she presented to AF clinic with recurrence. She was started on amiodarone, failed cardioversion. She was very symptomatic and referred for ablation. A procedure date was reserved for her, but this was expedited to today despite that I have not yet seen her in clinic to assess her and discuss the procedure.  Today, I reviewed and confirmed her interim history and discussed the procedure with her. She would like to proceed.  Past Medical History:  Diagnosis Date   ADHD (attention deficit hyperactivity disorder)    Anxiety    Asthma    Essential hypertension    Paroxysmal atrial fibrillation (HCC)    TMJ arthralgia    Past Surgical History:  Procedure Laterality Date   ATRIAL FIBRILLATION ABLATION N/A 09/11/2020   Procedure: ATRIAL FIBRILLATION ABLATION;  Surgeon: Hillis Range, MD;  Location: [facility]IVE [facility];  Service: Cardiovascular;  Laterality: N/A;   CARDIOVERSION N/A 07/06/2020   Procedure: CARDIOVERSION;  Surgeon: Laurey Morale, MD;  Location: [facility] ENDOSCOPY;  Service: Cardiovascular;  Laterality: N/A;   CARDIOVERSION N/A 08/22/2022   Procedure: CARDIOVERSION;  Surgeon: Christell Constant, MD;  Location: [facility] INVASIVE CV LAB;  Service: Cardiovascular;  Laterality: N/A;   TEE WITHOUT CARDIOVERSION N/A 09/10/2020   Procedure: TRANSESOPHAGEAL ECHOCARDIOGRAM (TEE);  Surgeon: Lewayne Bunting, MD;  Location:  [facility] ENDOSCOPY;  Service: Cardiovascular;  Laterality: N/A;   TONSILLECTOMY     TUBAL LIGATION      ROS- all systems are reviewed and negatives except as per HPI above  Current Facility-Administered Medications  Medication Dose Route Frequency Provider Last Rate Last Admin   0.9 %  sodium chloride infusion   Intravenous Continuous Baylen Dea, Roberts Gaudy, MD 50 mL/hr at 10/05/22 0910 New Bag at 10/05/22 0910    Physical Exam: Vitals:   10/05/22 0906  BP: 121/71  Pulse: 88  Resp: 18  Temp: 99 F (37.2 C)  TempSrc: Temporal  SpO2: 95%  Weight: 84.4 kg  Height: 5\' 8"  (1.727 m)    Gen: Appears comfortable, well-nourished CV: RRR, no dependent edema Pulm: breathing easily   Wt Readings from Last 3 Encounters:  10/05/22 84.4 kg  09/01/22 87.5 kg  08/31/22 87.3 kg    EKG tracing ordered today is personally reviewed and shows sinus  Assessment and Plan:  Persistent afib Pt with recurrence of AF; she has failed amiodarone. She has expressed a strong desire for repeat ablation due to severe symptoms and presents for the procedure today. We discussed the indication, rationale, logistics, anticipated benefits, and potential risks of the ablation procedure including but not limited to -- bleed at the groin access site, chest pain, damage to nearby organs such as the diaphragm, lungs, or esophagus, need for a drainage tube, or prolonged hospitalization. I explained that the risk for stroke, heart attack, need for open chest surgery, or even death is very low but not zero. she  expressed understanding and wishes  to proceed.  On xarelto for chads2vasc score of 2    2. HTN Stable No change required today  3. snoring Has now established for management of sleep apnea  I spent 45 minutes on this visit.   Maurice Small, MD 10/05/2022 10:41 AM

## 2022-10-05 NOTE — Transfer of Care (Signed)
Immediate Anesthesia Transfer of Care Note  Patient: Dana Ortiz  Procedure(s) Performed: ATRIAL FIBRILLATION ABLATION  Patient Location: PACU  Anesthesia Type:General  Level of Consciousness: awake  Airway & Oxygen Therapy: Patient Spontanous Breathing and Patient connected to nasal cannula oxygen  Post-op Assessment: Report given to RN and Post -op Vital signs reviewed and stable  Post vital signs: Reviewed and stable  Last Vitals:  Vitals Value Taken Time  BP 86/61 10/05/22 1400  Temp 37.1 C 10/05/22 1345  Pulse 66 10/05/22 1401  Resp 11 10/05/22 1401  SpO2 98 % 10/05/22 1401  Vitals shown include unvalidated device data.  Last Pain:  Vitals:   10/05/22 1338  TempSrc:   PainSc: 0-No pain         Complications: There were no known notable events for this encounter.

## 2022-10-05 NOTE — Discharge Instructions (Signed)

## 2022-10-05 NOTE — Progress Notes (Signed)
  Paged to see patient with hypotension and soreness in R groin.   R groin without hematoma or ecchymosis. Tender to light palpitation directly over gauze but not surrounding.   Will give 500 cc fluid (received 1000 during the case per RN, and has not eaten yet).   Pt raises question of pain control and I state I will discuss with the MD. They raise question of her having have Fentanyl administered during the procedure, but by records was at case start for anaesthesia induction.   Will discuss further with Dr. Nelly Laurence after his current case.   Casimiro Needle [address]" [place], [place]  10/05/2022 3:18 PM

## 2022-10-05 NOTE — Anesthesia Preprocedure Evaluation (Addendum)
Anesthesia Evaluation  Patient identified by MRN, date of birth, ID band Patient awake  General Assessment Comment:TMJ arthralgia   Reviewed: Allergy & Precautions, NPO status , Patient's Chart, lab work & pertinent test results, reviewed documented beta blocker date and time   Airway Mallampati: II  TM Distance: >3 FB Neck ROM: Full    Dental  (+) Teeth Intact, Dental Advisory Given, Caps,    Pulmonary asthma , sleep apnea    Pulmonary exam normal breath sounds clear to auscultation       Cardiovascular hypertension, Pt. on home beta blockers and Pt. on medications + dysrhythmias Atrial Fibrillation  Rhythm:Irregular Rate:Abnormal     Neuro/Psych  PSYCHIATRIC DISORDERS Anxiety     negative neurological ROS     GI/Hepatic negative GI ROS, Neg liver ROS,,,  Endo/Other  negative endocrine ROS    Renal/GU negative Renal ROS     Musculoskeletal negative musculoskeletal ROS (+)    Abdominal   Peds  (+) ADHD Hematology  (+) Blood dyscrasia (Xarelto)   Anesthesia Other Findings Day of surgery medications reviewed with the patient.  Reproductive/Obstetrics                             Anesthesia Physical Anesthesia Plan  ASA: 3  Anesthesia Plan: General   Post-op Pain Management: Tylenol PO (pre-op)*   Induction: Intravenous  PONV Risk Score and Plan: 3 and Dexamethasone and Ondansetron  Airway Management Planned: Oral ETT  Additional Equipment:   Intra-op Plan:   Post-operative Plan: Extubation in OR  Informed Consent: I have reviewed the patients History and Physical, chart, labs and discussed the procedure including the risks, benefits and alternatives for the proposed anesthesia with the patient or authorized representative who has indicated his/her understanding and acceptance.     Dental advisory given  Plan Discussed with: CRNA  Anesthesia Plan Comments:          Anesthesia Quick Evaluation

## 2022-10-05 NOTE — Anesthesia Procedure Notes (Signed)
Procedure Name: Intubation Date/Time: 10/05/2022 11:16 AM  Performed by: Loleta Christne Platts, CRNAPre-anesthesia Checklist: Patient identified, Patient being monitored, Timeout performed, Emergency Drugs available and Suction available Patient Re-evaluated:Patient Re-evaluated prior to induction Oxygen Delivery Method: Circle system utilized Preoxygenation: Pre-oxygenation with 100% oxygen Induction Type: IV induction Ventilation: Mask ventilation without difficulty Laryngoscope Size: 3 and Glidescope Grade View: Grade I Tube type: Oral Tube size: 7.0 mm Number of attempts: 2 (First attempt MAC 4, Grade IV view.) Airway Equipment and Method: Stylet Placement Confirmation: ETT inserted through vocal cords under direct vision, positive ETCO2 and breath sounds checked- equal and bilateral Secured at: 22 cm Tube secured with: Tape Dental Injury: Teeth and Oropharynx as per pre-operative assessment

## 2022-10-05 NOTE — Progress Notes (Signed)
Patient arrived from short stay, alert and oriented x 4. Patient's bilateral groin site is soft, no signs of hematoma. Patient relayed that she has 0/10 pain in both sites. RN obtained VS and VS stable. Tele monitor applied and CCMD notified. Bed lowered to lowest setting. Call bell within reach. Patient is on phone with nutrition, ordering dinner. Family at bedside.

## 2022-10-05 NOTE — Progress Notes (Signed)
Attempted to set pt up on CPAP via nasal mask per pt request. She stated her last settings were between 11-13 on average. Attempted CPAP of 11.5. Pt felt like she could not catch her breath. Changed mode to Auto BiPAP IPAPmax16 EPAPmin11.5 PS 4. Pt stated it was more comfortable and would try again for HS. RN notified that pt will call when ready.

## 2022-10-05 NOTE — Progress Notes (Signed)
Enter 14:15

## 2022-10-05 NOTE — Anesthesia Postprocedure Evaluation (Signed)
Anesthesia Post Note  Patient: Dana Ortiz  Procedure(s) Performed: ATRIAL FIBRILLATION ABLATION     Patient location during evaluation: PACU Anesthesia Type: General Level of consciousness: awake and alert Pain management: pain level controlled Vital Signs Assessment: post-procedure vital signs reviewed and stable Respiratory status: spontaneous breathing, nonlabored ventilation, respiratory function stable and patient connected to nasal cannula oxygen Cardiovascular status: blood pressure returned to baseline and stable Postop Assessment: no apparent nausea or vomiting Anesthetic complications: no   There were no known notable events for this encounter.  Last Vitals:  Vitals:   10/05/22 1512 10/05/22 1515  BP: (!) 83/57 (!) 92/44  Pulse: 62 65  Resp: 12 12  Temp:    SpO2: 93% 96%    Last Pain:  Vitals:   10/05/22 1515  TempSrc:   PainSc: 6                  Collene Schlichter

## 2022-10-06 ENCOUNTER — Encounter (HOSPITAL_COMMUNITY): Payer: Self-pay | Admitting: Cardiovascular Disease

## 2022-10-06 MED ORDER — LOSARTAN POTASSIUM 25 MG PO TABS
25.0000 mg | ORAL_TABLET | Freq: Every day | ORAL | Status: DC
  Administered 2022-10-06: 25 mg via ORAL
  Filled 2022-10-06: qty 1

## 2022-10-06 MED ORDER — BISOPROLOL FUMARATE 5 MG PO TABS
5.0000 mg | ORAL_TABLET | Freq: Every day | ORAL | Status: DC
  Administered 2022-10-06: 5 mg via ORAL
  Filled 2022-10-06: qty 1

## 2022-10-06 MED ORDER — LOSARTAN POTASSIUM 50 MG PO TABS: ORAL_TABLET | ORAL | Status: DC

## 2022-10-06 MED FILL — Fentanyl Citrate Preservative Free (PF) Inj 100 MCG/2ML: INTRAMUSCULAR | Qty: 2 | Status: AC

## 2022-10-06 NOTE — Progress Notes (Signed)
   10/06/22 0030  BiPAP/CPAP/SIPAP  $ Non-Invasive Home Ventilator  Subsequent  $ Face Mask Small Yes  BiPAP/CPAP/SIPAP Pt Type Adult  BiPAP/CPAP/SIPAP DREAMSTATIOND  Mask Type Full face mask  Mask Size Small  IPAP  (16 max)  EPAP  (11.5 min)  Pressure Support 4 cmH20  FiO2 (%) 21 %  BiPAP/CPAP /SiPAP Vitals  Pulse Rate 83  Resp 18  SpO2 99 %  MEWS Score/Color  MEWS Score 0  MEWS Score Color [place] Si

## 2022-10-06 NOTE — Progress Notes (Signed)
Pt's b/p improved throughout night. Ambulated to BR w/ sba. Tolerated. Groins assessed. Mild ecchymosis and drainage noted to R groin. Site marked. R groin remains tender on palpation. Pt removed CPAP. This RN offered nasal cannula. Pt declined nasal cannula as well. Education provided.

## 2022-10-06 NOTE — TOC Transition Note (Addendum)
Transition of Care [facility]) - CM/SW Discharge Note   Patient Details  Name: Dana Ortiz MRN: [id number] Date of Birth: Jan 23, 1971  Transition of Care [facility]) CM/SW Contact:  Leone Haven, RN Phone Number: 10/06/2022, 8:50 AM   Clinical Narrative:    Patient is for dc today, Ryan her sister will transport her home. NCM will ast with [employer] for meds.  She has no other needs.   Final next level of care: Home/Self Care Barriers to Discharge: No Barriers Identified   Patient Goals and CMS Choice   Choice offered to / list presented to : NA  Discharge Placement                         Discharge Plan and Services Additional resources added to the After Visit Summary for   In-house Referral: NA Discharge Planning Services: CM Consult Post Acute Care Choice: NA          DME Arranged: N/A DME Agency: NA       HH Arranged: NA          Social Determinants of Health (SDOH) Interventions SDOH Screenings   Tobacco Use: Low Risk  (10/06/2022)     Readmission Risk Interventions     No data to display

## 2022-10-06 NOTE — TOC Initial Note (Signed)
Transition of Care [facility]) - Initial/Assessment Note    Patient Details  Name: Dana Ortiz MRN: [id number] Date of Birth: 19-May-1970  Transition of Care [facility]) CM/SW Contact:    Leone Haven, RN Phone Number: 10/06/2022, 8:47 AM  Clinical Narrative:                 From home with a friend, who lives right by her sister, Deirdre Priest.  She has PCP, but she does not have insurance on file, she states she has filed for [facility] and she should have it on October 17 2022.  She currently does not have any DME or HH services.  Deirdre Priest , her sister is at the bedside and will transport her home. She is indep now and was indep pta.   Expected Discharge Plan: Home/Self Care Barriers to Discharge: No Barriers Identified   Patient Goals and CMS Choice Patient states their goals for this hospitalization and ongoing recovery are:: return home   Choice offered to / list presented to : NA      Expected Discharge Plan and Services In-house Referral: NA Discharge Planning Services: CM Consult Post Acute Care Choice: NA Living arrangements for the past 2 months: Single Family Home Expected Discharge Date: 10/06/22               DME Arranged: N/A DME Agency: NA       HH Arranged: NA          Prior Living Arrangements/Services Living arrangements for the past 2 months: Single Family Home Lives with:: Friends Patient language and need for interpreter reviewed:: Yes Do you feel safe going back to the place where you live?: Yes      Need for Family Participation in Patient Care: Yes (Comment) Care giver support system in place?: Yes (comment)   Criminal Activity/Legal Involvement Pertinent to Current Situation/Hospitalization: No - Comment as needed  Activities of Daily Living Home Assistive Devices/Equipment: None ADL Screening (condition at time of admission) Patient's cognitive ability adequate to safely complete daily activities?: Yes Is the patient deaf or have  difficulty hearing?: No Does the patient have difficulty seeing, even when wearing glasses/contacts?: No Does the patient have difficulty concentrating, remembering, or making decisions?: No Patient able to express need for assistance with ADLs?: Yes Does the patient have difficulty dressing or bathing?: No Independently performs ADLs?: Yes (appropriate for developmental age) Does the patient have difficulty walking or climbing stairs?: No Weakness of Legs: None Weakness of Arms/Hands: None  Permission Sought/Granted                  Emotional Assessment Appearance:: Appears stated age Attitude/Demeanor/Rapport: Engaged Affect (typically observed): Appropriate Orientation: : Oriented to Self, Oriented to Place, Oriented to  Time, Oriented to Situation   Psych Involvement: No (comment)  Admission diagnosis:  Admitted for observation [Z04.9] Persistent atrial fibrillation (HCC) [I48.19] Patient Active Problem List   Diagnosis Date Noted   Admitted for observation 10/05/2022   OSA (obstructive sleep apnea) 05/10/2022   Excessive daytime sleepiness 05/10/2022   Asthma 05/10/2022   Persistent atrial fibrillation (HCC) 09/11/2020   Longstanding persistent atrial fibrillation (HCC) 07/02/2020   PCP:  Rebecka Apley, NP Pharmacy:   [facility] [place] - [address] [place] [address] Phone: [phone] Fax: [phone]     Social Determinants of Health (SDOH) Social History: SDOH Screenings   Tobacco Use: Low Risk  (10/06/2022)  SDOH Interventions:     Readmission Risk Interventions     No data to display

## 2022-10-06 NOTE — Discharge Summary (Signed)
ELECTROPHYSIOLOGY PROCEDURE DISCHARGE SUMMARY    Patient ID: Dana Ortiz,  MRN: [id number], DOB/AGE: May 20, 1970 52 y.o.  Admit date: 10/05/2022 Discharge date: 10/06/2022  Primary Care Physician: Rebecka Apley, NP  Primary Cardiologist: Nona Dell, MD  Electrophysiologist: Dr. Nelly Laurence   Primary Discharge Diagnosis:  Atrial Fibrillation   Procedures This Admission:  1.  Electrophysiology study and radiofrequency catheter ablation of Atrial Fibrillation on 6/19 by Dr. Nelly Laurence .  This study demonstrated: 1. Successful re-ablation of the  PVI 2. Successful ablation/isolation of the posterior wall 3. Intracardiac echo reveals no significant effusion 4. No early apparent complications.    Brief HPI: Dana Ortiz is a 52 y.o. female with a history of Atrial Fibrillation.  They have failed medical therapy with amiodarone. Risks, benefits, and alternatives to catheter ablation of Atrial Fibrillation were reviewed with the patient who wished to proceed.   The patient has been on uninterrupted anticoagulation for more than 3 weeks and did not require TEE.  Hospital Course:  The patient was admitted and underwent EPS/RFCA of Atrial Fibrillation with details as outlined above.  They were monitored overnight with on-going hypotension post procedure. Telemetry demonstrated NSR.  Groin was without complication on the day of discharge.  The patient was examined and considered to be stable for discharge.  Wound care and restrictions were reviewed with the patient.  The patient will be seen back by Afib Clinic in 4 weeks and Dr. Nelly Laurence in 12 weeks for post ablation follow up.   CHA2DS2VASC is at least 2.  Physical Exam: Vitals:   10/06/22 0030 10/06/22 0255 10/06/22 0403 10/06/22 0451  BP:  107/60 127/77   Pulse: 83  98   Resp: 18  18   Temp:   97.7 F (36.5 C)   TempSrc:   Axillary   SpO2: 99%  100%   Weight:   90.1 kg 90.9 kg  Height:        GEN- NAD. A&O x 3.   HEENT: Normocephalic, atraumatic Lungs- CTAB, Normal effort.  Heart- RRR, No M/G/R.  GI- Soft, NT, ND.  Extremities- No clubbing, cyanosis, or edema;  Skin- warm and dry, no rash or lesion, left chest without hematoma/ecchymosis  Discharge Medications:  Allergies as of 10/06/2022       Reactions   Morphine And Codeine Anaphylaxis, Other (See Comments)   Can tolerate orally- did not tolerate when it was introduced via IV         Medication List     TAKE these medications    albuterol 108 (90 Base) MCG/ACT inhaler Commonly known as: VENTOLIN HFA Inhale 2 puffs into the lungs every 4 (four) hours as needed for wheezing or shortness of breath.   amiodarone 200 MG tablet Commonly known as: PACERONE Take 1 tablet by mouth twice a day for 1 month then reduce to 1 tablet daily What changed:  how much to take how to take this when to take this additional instructions   amphetamine-dextroamphetamine 20 MG tablet Commonly known as: ADDERALL Take 20 mg by mouth every 8 (eight) hours.   bisoprolol 5 MG tablet Commonly known as: ZEBETA TAKE 1/2 TABLET BY MOUTH DAILY (cut in half) What changed: See the new instructions.   celecoxib 200 MG capsule Commonly known as: CELEBREX Take 200 mg by mouth daily as needed (for pain).   citalopram 20 MG tablet Commonly known as: CELEXA Take 20 mg by mouth daily.   fluticasone 50 MCG/ACT nasal spray Commonly  known as: FLONASE Place 1 spray into both nostrils daily as needed for allergies or rhinitis.   hydrOXYzine 10 MG tablet Commonly known as: ATARAX Take 10 mg by mouth every 6 (six) hours as needed (for sleep).   losartan 50 MG tablet Commonly known as: COZAAR Take 25 mg ( 0.5 tablet) daily. If BP starts to rise to > 120 for the top number, can go back to previous dose of 50 mg daily. What changed:  how much to take how to take this when to take this additional instructions   NON FORMULARY Take 3 capsules by mouth See  admin instructions. [place] mushroom capsules- Take 3 capsules by mouth every morning   rivaroxaban 20 MG Tabs tablet Commonly known as: Xarelto TAKE 1 TABLET BY MOUTH DAILY WITH SUPPER What changed:  how much to take how to take this when to take this additional instructions   Tylenol 8 Hour 650 MG CR tablet Generic drug: acetaminophen Take 1,300 mg by mouth 2 (two) times daily as needed for pain.        Disposition: Home      Follow-up Information     [facility] Follow up.   Specialty: Cardiology Why: on 7/17 at 900 am for post ablation follow up Contact information: [address] [id number] [address]c [place] [address] [phone]                Duration of Discharge Encounter: Greater than 30 minutes including physician time.  Dustin Flock, PA-C  10/06/2022 7:47 AM

## 2022-10-07 ENCOUNTER — Encounter: Payer: Self-pay | Admitting: Cardiovascular Disease

## 2022-10-07 ENCOUNTER — Ambulatory Visit (HOSPITAL_COMMUNITY): Payer: PRIVATE HEALTH INSURANCE | Admitting: Physician Assistant

## 2022-10-31 ENCOUNTER — Encounter: Payer: Self-pay | Admitting: Cardiovascular Disease

## 2022-10-31 ENCOUNTER — Other Ambulatory Visit: Payer: Self-pay | Admitting: Cardiovascular Disease

## 2022-10-31 MED ORDER — AMIODARONE HCL 200 MG PO TABS: 200.0000 mg | ORAL_TABLET | Freq: Every day | ORAL | 3 refills | Status: DC

## 2022-11-02 ENCOUNTER — Encounter (HOSPITAL_COMMUNITY): Payer: Self-pay | Admitting: Internal Medicine

## 2022-11-02 ENCOUNTER — Ambulatory Visit (HOSPITAL_COMMUNITY)
Admission: RE | Admit: 2022-11-02 | Discharge: 2022-11-02 | Disposition: A | Discharge status: Home / Self Care | Payer: Medicaid Other | Source: Ambulatory Visit | Attending: Internal Medicine | Admitting: Internal Medicine

## 2022-11-02 VITALS — BP 106/80 | HR 67 | Ht 68.0 in | Wt 191.0 lb

## 2022-11-02 DIAGNOSIS — I10 Essential (primary) hypertension: Secondary | ICD-10-CM | POA: Insufficient documentation

## 2022-11-02 DIAGNOSIS — I4811 Longstanding persistent atrial fibrillation: Secondary | ICD-10-CM | POA: Insufficient documentation

## 2022-11-02 DIAGNOSIS — Z79899 Other long term (current) drug therapy: Secondary | ICD-10-CM | POA: Insufficient documentation

## 2022-11-02 DIAGNOSIS — I4819 Other persistent atrial fibrillation: Secondary | ICD-10-CM | POA: Diagnosis not present

## 2022-11-02 DIAGNOSIS — D6869 Other thrombophilia: Secondary | ICD-10-CM | POA: Insufficient documentation

## 2022-11-02 DIAGNOSIS — G4733 Obstructive sleep apnea (adult) (pediatric): Secondary | ICD-10-CM | POA: Insufficient documentation

## 2022-11-02 DIAGNOSIS — Z7901 Long term (current) use of anticoagulants: Secondary | ICD-10-CM | POA: Insufficient documentation

## 2022-11-02 NOTE — Progress Notes (Signed)
Primary Care Physician: Rebecka Apley, NP Primary Cardiologist: Dr Diona Browner Primary Electrophysiologist: Dr Nelly Laurence  Referring Physician: Dr Alvan Dame Dana Ortiz is a 52 y.o. female with a history of HTN, OSA, and atrial fibrillation who presents for follow up in the [facility]. Patient is on Xarelto for a CHADS2VASC score of 2. She was seen by Dr Diona Browner on 05/12/20 and was believed to be in persistent atrial fibrillation for several months at least, if not longer, per the patient's Apple Watch. She does have symptoms of fatigue and dyspnea with activity while in afib. She has been intolerant of metoprolol and diltiazem in the past due to side effects. She denies significant alcohol use. Patient is s/p DCCV on 07/06/20 with quick return of her afib just under two days later. She does report she felt "great" for those two days which much more energy. Patient is s/p afib ablation 09/12/20.   Patient reported on 06/25/22 after work she had symptoms of palpitations, SOB, dizziness, and chest heaviness. Her smart watch showed afib. This is her first episode since her ablation in 2022. She deferred DCCV until she could get started on CPAP for her severe OSA.  On follow up today, patient is s/p DCCV on 08/22/22 but quickly returned to afib. She continues to feel very fatigued. No bleeding issues on anticoagulation.   On follow up 11/02/22, patient is currently in NSR. She is s/p Afib ablation on 10/05/22 by Dr. Nelly Laurence. She required overnight stay due to hypotension post procedure. No episodes of Afib since ablation. She takes losartan 25 mg daily and still notes intermittent lightheadedness. No chest pain, SOB, or trouble swallowing. Leg sites healed without issue. No missed doses of anticoagulant.  Today, she denies symptoms of orthopnea, PND, lower extremity edema, dizziness, presyncope, syncope, snoring, daytime somnolence, bleeding, or neurologic sequela. The  patient is tolerating medications without difficulties and is otherwise without complaint today.   Atrial Fibrillation Risk Factors:  she does have symptoms or diagnosis of sleep apnea. She is compliant with CPAP therapy.  she does not have a history of rheumatic fever. she does not have a history of alcohol use. The patient does not have a history of early familial atrial fibrillation or other arrhythmias.  she has a BMI of Body mass index is 29.04 kg/m.Marland Kitchen Filed Weights   11/02/22 0858  Weight: 86.6 kg     Family History  Problem Relation Age of Onset   Diabetes Mother    Glaucoma Mother    Cancer - Ovarian Mother    Kidney cancer Father    Heart Problems Brother      Atrial Fibrillation Management history:  Previous antiarrhythmic drugs: flecainide, amiodarone  Previous cardioversions: 07/06/20 Previous ablations: 09/12/20, 10/05/22 CHADS2VASC score: 2 Anticoagulation history: Xarelto   Past Medical History:  Diagnosis Date   ADHD (attention deficit hyperactivity disorder)    Anxiety    Asthma    Essential hypertension    Paroxysmal atrial fibrillation (HCC)    TMJ arthralgia    Past Surgical History:  Procedure Laterality Date   ATRIAL FIBRILLATION ABLATION N/A 09/11/2020   Procedure: ATRIAL FIBRILLATION ABLATION;  Surgeon: Hillis Range, MD;  Location: [facility] INVASIVE CV LAB;  Service: Cardiovascular;  Laterality: N/A;   ATRIAL FIBRILLATION ABLATION N/A 10/05/2022   Procedure: ATRIAL FIBRILLATION ABLATION;  Surgeon: Maurice Small, MD;  Location: [facility] INVASIVE CV LAB;  Service: Cardiovascular;  Laterality: N/A;   CARDIOVERSION N/A 07/06/2020  Procedure: CARDIOVERSION;  Surgeon: Laurey Morale, MD;  Location: [facility] ENDOSCOPY;  Service: Cardiovascular;  Laterality: N/A;   CARDIOVERSION N/A 08/22/2022   Procedure: CARDIOVERSION;  Surgeon: Christell Constant, MD;  Location: [facility] INVASIVE CV LAB;  Service: Cardiovascular;  Laterality: N/A;   TEE WITHOUT CARDIOVERSION  N/A 09/10/2020   Procedure: TRANSESOPHAGEAL ECHOCARDIOGRAM (TEE);  Surgeon: Lewayne Bunting, MD;  Location: [facility] ENDOSCOPY;  Service: Cardiovascular;  Laterality: N/A;   TONSILLECTOMY     TUBAL LIGATION      Current Outpatient Medications  Medication Sig Dispense Refill   albuterol (VENTOLIN HFA) 108 (90 Base) MCG/ACT inhaler Inhale 2 puffs into the lungs every 4 (four) hours as needed for wheezing or shortness of breath.     amiodarone (PACERONE) 200 MG tablet Take 1 tablet (200 mg total) by mouth daily. Take 1 tablet by mouth daily 30 tablet 3   amphetamine-dextroamphetamine (ADDERALL) 20 MG tablet Take 20 mg by mouth every 8 (eight) hours.     bisoprolol (ZEBETA) 5 MG tablet TAKE 1/2 TABLET BY MOUTH DAILY (cut in half) (Patient taking differently: Take 2.5 mg by mouth daily.) 45 tablet 3   celecoxib (CELEBREX) 200 MG capsule Take 200 mg by mouth daily as needed (for pain).     citalopram (CELEXA) 20 MG tablet Take 20 mg by mouth daily.     fluticasone (FLONASE) 50 MCG/ACT nasal spray Place 1 spray into both nostrils daily as needed for allergies or rhinitis.     hydrOXYzine (ATARAX) 10 MG tablet Take 10 mg by mouth every 6 (six) hours as needed (for sleep).     losartan (COZAAR) 50 MG tablet Take 25 mg ( 0.5 tablet) daily. If BP starts to rise to > 120 for the top number, can go back to previous dose of 50 mg daily.     Multiple Vitamin (MULTIVITAMIN) tablet Take 1 tablet by mouth daily.     NON FORMULARY Take 3 capsules by mouth See admin instructions. [place] mushroom capsules- Take 3 capsules by mouth every morning     NON FORMULARY daily at 6 (six) AM. Black cohosh     rivaroxaban (XARELTO) 20 MG TABS tablet TAKE 1 TABLET BY MOUTH DAILY WITH SUPPER (Patient taking differently: Take 20 mg by mouth daily with supper.) 90 tablet 3   TYLENOL 8 HOUR 650 MG CR tablet Take 1,300 mg by mouth 2 (two) times daily as needed for pain.     No current facility-administered medications for this  encounter.    Allergies  Allergen Reactions   Morphine And Codeine Anaphylaxis and Other (See Comments)    Can tolerate orally- did not tolerate when it was introduced via IV     ROS- All systems are reviewed and negative except as per the HPI above.  Physical Exam: Vitals:   11/02/22 0858  BP: 106/80  Pulse: 67  Weight: 86.6 kg  Height: 5\' 8"  (1.727 m)    GEN- The patient is well appearing, alert and oriented x 3 today.   Neck - no JVD or carotid bruit noted Lungs- Clear to ausculation bilaterally, normal work of breathing Heart- Regular rate and rhythm, no murmurs, rubs or gallops, PMI not laterally displaced Extremities- no clubbing, cyanosis, or edema Skin - no rash or ecchymosis noted   Wt Readings from Last 3 Encounters:  11/02/22 86.6 kg  10/06/22 90.9 kg  09/01/22 87.5 kg    EKG today demonstrates  Vent. rate 67 BPM PR interval 138 ms  QRS duration 88 ms QT/QTcB 434/458 ms P-R-T axes 76 44 52 Normal sinus rhythm Low voltage QRS Borderline ECG When compared with ECG of 06-Oct-2022 06:03, PREVIOUS ECG IS PRESENT  Echo 07/01/20 demonstrated  1. Left ventricular ejection fraction, by estimation, is approximately  50%. The left ventricle has low normal function. The left ventricle  demonstrates global hypokinesis. There is mild left ventricular  hypertrophy. Left ventricular diastolic parameters are indeterminate.   2. Right ventricular systolic function is normal. The right ventricular  size is normal. There is normal pulmonary artery systolic pressure. The  estimated right ventricular systolic pressure is 22.0 mmHg.   3. Left atrial size was moderately to severely dilated.   4. Right atrial size was upper normal.   5. There is a trivial pericardial effusion posterior to the left  ventricle.   6. The mitral valve is grossly normal. Mild to moderate mitral valve  regurgitation made up of two jets.   7. The aortic valve is tricuspid. Aortic valve  regurgitation is not  visualized.   8. The inferior vena cava is normal in size with greater than 50%  respiratory variability, suggesting right atrial pressure of 3 mmHg.   9. Cannot exclude small left to right interatrial shunt, possible PFO.   Comparison(s): Echocardiogram done 09/29/16 showed an EF of 55-60%.   Epic records are reviewed at length today  CHA2DS2-VASc Score = 2  The patient's score is based upon: CHF History: 0 HTN History: 1 Diabetes History: 0 Stroke History: 0 Vascular Disease History: 0 Age Score: 0 Gender Score: 1       ASSESSMENT AND PLAN: 1. Longstanding Persistent Atrial Fibrillation (ICD10:  I48.11) The patient's CHA2DS2-VASc score is 2, indicating a 2.2% annual risk of stroke.   Previously failed flecainide. S/p afib ablation 09/12/20 S/p DCCV 08/22/22 which was unsuccessful.  S/p Afib ablation on 10/05/22 by Dr. Nelly Laurence.  She is currently in NSR.  Continue amiodarone 200 mg daily. Anticipate will be discontinued at next OV.  Continue Xarelto 20 mg daily.  Consider Tikosyn in the future if AAD is required for increased burden.    2. OSA Severe on HST Followed by Dr Vassie Loll. Encouraged compliance with CPAP therapy.  3. HTN Patient has intermittent lightheadedness so recommended temporary discontinuation of losartan. Trend BP. Can resume likely after amiodarone is discontinued.  4. VHD Mild to moderate MR Followed by Dr Diona Browner.    Follow up as scheduled with Dr. Nelly Laurence.   Dana Bells, PA-C Afib Clinic [facility] [address] [place], [place] [address] [phone] 11/02/2022 9:26 AM

## 2022-11-08 ENCOUNTER — Encounter: Payer: Self-pay | Admitting: Cardiovascular Disease

## 2022-11-10 ENCOUNTER — Encounter: Payer: Self-pay | Admitting: Cardiovascular Disease

## 2023-01-20 ENCOUNTER — Encounter: Payer: Self-pay | Admitting: Cardiovascular Disease

## 2023-01-20 ENCOUNTER — Ambulatory Visit: Payer: Medicaid Other | Attending: Cardiovascular Disease | Admitting: Cardiovascular Disease

## 2023-01-20 ENCOUNTER — Ambulatory Visit: Payer: PRIVATE HEALTH INSURANCE | Admitting: Cardiovascular Disease

## 2023-01-20 VITALS — BP 130/80 | HR 58 | Ht 68.0 in | Wt 194.0 lb

## 2023-01-20 DIAGNOSIS — I4819 Other persistent atrial fibrillation: Secondary | ICD-10-CM

## 2023-01-20 NOTE — Progress Notes (Signed)
   PCP: Rebecka Apley, NP Primary Cardiologist: Dr Diona Browner Primary EP: Dr Nelly Laurence  Dana Ortiz is a 52 y.o. female who presents today for routine electrophysiology followup.         S he has a history of longstanding persistent atrial fibrillation and underwent ablation by Dr. Johney Frame in May 2022.  He had recurrence of atrial fibrillation and underwent DC cardioversion in May 2024 that was accessible.  She underwent ablation for atrial fibrillation on October 05, 2022.  She had a small area of connection on the right superior pulmonary vein and significant breakthrough along the inferior posterior wall line that had been previously performed.  I then reinforced the prior pulmonary vein encirclement and posterior wall lesion sets.  She did have transient hypotension after the procedure, likely due to dehydration and anesthesia.  In follow-up in atrial fibrillation clinic on July 17, she remained in normal sinus rhythm.      Today, she denies symptoms of palpitations, chest pain, shortness of breath,  lower extremity edema, dizziness, presyncope, or syncope.  The patient is otherwise without complaint today.    Physical Exam: Vitals:   01/20/23 1240  BP: 130/80  Pulse: (!) 58  SpO2: 98%  Weight: 194 lb (88 kg)  Height: 5\' 8"  (1.727 m)     Gen: Appears comfortable, well-nourished CV: RRR, no dependent edema Pulm: breathing easily   Wt Readings from Last 3 Encounters:  01/20/23 194 lb (88 kg)  11/02/22 191 lb (86.6 kg)  10/06/22 200 lb 6.4 oz (90.9 kg)    EKG tracing ordered today is personally reviewed and shows sinus  Assessment and Plan:  Persistent afib Well controlled post ablation Previously failed flecainide Discontinue amiodarone If she has recurrence, I don't see why she wouldn't be a candidate to resume flecainide.  Secondary hypercoagulable state On xarelto 15 mg for chads2vasc score of 2   HTN Stable No change required today  Obstructive sleep  apnea We discussed the importance of CPAP use for the maintenance of durable sinus rhythm  Follow-up with me or Dr Diona Browner in 6 months  Maurice Small, MD 01/20/2023 12:57 PM

## 2023-01-20 NOTE — Patient Instructions (Signed)
Medication Instructions:   Stop Amiodarone  Continue all other medications.     Labwork:  none  Testing/Procedures:  none  Follow-Up:  3 months   Any Other Special Instructions Will Be Listed Below (If Applicable).   If you need a refill on your cardiac medications before your next appointment, please call your pharmacy.'

## 2023-03-21 ENCOUNTER — Telehealth: Payer: Self-pay | Admitting: Cardiovascular Disease

## 2023-03-21 MED ORDER — BISOPROLOL FUMARATE 5 MG PO TABS: 5.0000 mg | ORAL_TABLET | Freq: Every day | ORAL | 1 refills | Status: DC

## 2023-03-21 NOTE — Telephone Encounter (Signed)
Filled

## 2023-03-21 NOTE — Telephone Encounter (Signed)
     1. Which medications need to be refilled? (please list name of each medication and dose if known) bisoprolol (ZEBETA) 5 MG tablet    2. Would you like to learn more about the convenience, safety, & potential cost savings by using the [facility]? MAYBE   3. Are you open to using the [facility] (Type [facility]. MAYBE).   4. Which pharmacy/location (including street and city if local pharmacy) is medication to be sent to?[facility]   5. Do they need a 30 day or 90 day supply? 90  Patient is completely out of medication .

## 2023-04-11 ENCOUNTER — Other Ambulatory Visit (HOSPITAL_COMMUNITY): Payer: Self-pay

## 2023-04-11 ENCOUNTER — Telehealth: Payer: Self-pay | Admitting: Pharmacy Technician

## 2023-04-11 NOTE — Telephone Encounter (Signed)
Pharmacy Patient Advocate Encounter   Received notification from CoverMyMeds that prior authorization for Bisoprolol Fumarate 5MG  tablets is required/requested.   Insurance verification completed.   The patient is insured through [facility] .   Per test claim: PA required; PA submitted to above mentioned insurance via CoverMyMeds Key/confirmation #/EOC [id number] Status is pending

## 2023-04-13 ENCOUNTER — Other Ambulatory Visit (HOSPITAL_COMMUNITY): Payer: Self-pay

## 2023-04-13 NOTE — Telephone Encounter (Signed)
Pharmacy Patient Advocate Encounter  Received notification from [facility] that Prior Authorization for Bisoprolol Fumarate 5MG  tablets  has been APPROVED from 04/11/23 to 04/10/24. Ran test claim, Copay is $4.00 for 90 days. This test claim was processed through [facility]- copay amounts may vary at other pharmacies due to pharmacy/plan contracts, or as the patient moves through the different stages of their insurance plan.   PA #/Case ID/Reference #: [id number]

## 2023-04-21 ENCOUNTER — Ambulatory Visit: Payer: Medicaid Other | Admitting: Cardiovascular Disease

## 2023-04-24 ENCOUNTER — Other Ambulatory Visit: Payer: Self-pay | Admitting: Medical Genetics

## 2023-04-26 ENCOUNTER — Encounter: Payer: Self-pay | Admitting: Cardiovascular Disease

## 2023-04-27 ENCOUNTER — Other Ambulatory Visit (HOSPITAL_COMMUNITY)
Admission: RE | Admit: 2023-04-27 | Discharge: 2023-04-27 | Disposition: A | Discharge status: Home / Self Care | Payer: Self-pay | Source: Ambulatory Visit | Attending: Medical Genetics | Admitting: Medical Genetics

## 2023-04-27 ENCOUNTER — Other Ambulatory Visit (HOSPITAL_COMMUNITY): Payer: Self-pay

## 2023-04-28 ENCOUNTER — Ambulatory Visit: Payer: Medicaid Other | Admitting: Cardiovascular Disease

## 2023-05-12 LAB — GENECONNECT MOLECULAR SCREEN: Genetic Analysis Overall Interpretation: NEGATIVE

## 2023-05-19 ENCOUNTER — Ambulatory Visit: Payer: Medicaid Other | Attending: Cardiology | Admitting: Cardiology

## 2023-05-19 ENCOUNTER — Encounter: Payer: Self-pay | Admitting: Cardiology

## 2023-05-19 ENCOUNTER — Encounter (HOSPITAL_BASED_OUTPATIENT_CLINIC_OR_DEPARTMENT_OTHER): Payer: Self-pay | Admitting: Pulmonary Disease

## 2023-05-19 VITALS — BP 110/72 | HR 75 | Ht 68.0 in | Wt 198.2 lb

## 2023-05-19 DIAGNOSIS — I10 Essential (primary) hypertension: Secondary | ICD-10-CM

## 2023-05-19 DIAGNOSIS — I4819 Other persistent atrial fibrillation: Secondary | ICD-10-CM | POA: Diagnosis not present

## 2023-05-19 NOTE — Patient Instructions (Signed)

## 2023-05-19 NOTE — Progress Notes (Signed)
    Cardiology Office Note  Date: 05/19/2023   ID: Antia Rahal, DOB 08-17-1970, MRN [id number]  History of Present Illness: Dana Ortiz is a 53 y.o. female presenting for a follow-up visit, I have not seen her in the office since 2022.  I reviewed substantial interval records including follow-up in the atrial fibrillation clinic and also with Dr. Nelly Laurence.  She states that she has been doing quite well, no interval palpitations or atrial fibrillation alerts from her Apple Watch.  I reviewed her medications.  Current regimen includes bisoprolol, losartan, and Xarelto.  She does not report any spontaneous bleeding problems.  I reviewed her ECG today which shows normal sinus rhythm, borderline low voltage.  She continues to see Dr. Vassie Loll for management of OSA.  States that she has had difficulty tolerating current CPAP mask and would like to discuss other options for treatment.  Physical Exam: VS:  BP 110/72   Pulse 75   Ht 5\' 8"  (1.727 m)   Wt 198 lb 3.2 oz (89.9 kg)   SpO2 97%   BMI 30.14 kg/m , BMI Body mass index is 30.14 kg/m.  Wt Readings from Last 3 Encounters:  05/19/23 198 lb 3.2 oz (89.9 kg)  01/20/23 194 lb (88 kg)  11/02/22 191 lb (86.6 kg)    General: Patient appears comfortable at rest. HEENT: Conjunctiva and lids normal. Lungs: Clear to auscultation, nonlabored breathing at rest. Cardiac: Regular rate and rhythm, no S3 or significant systolic murmur, no pericardial rub.  ECG:  An ECG dated 01/20/2023 was personally reviewed today and demonstrated:  Sinus bradycardia with borderline low voltage, lead motion artifact.  Labwork: 08/31/2022: ALT 37; AST 26; TSH 2.259 10/03/2022: BUN 37; Creatinine, Ser 1.07; Hemoglobin 14.2; Platelets 275; Potassium 4.0; Sodium 138  October 2024: Hemoglobin 14.1, platelets 256, BUN 19, creatinine 0.9, GFR 76, potassium 4.6, AST 21, ALT 22  Other Studies Reviewed Today:  No interval cardiac testing for review  today.  Assessment and Plan:  1.  Persistent atrial fibrillation with CHA2DS2-VASc score of 2.  She underwent atrial fibrillation ablation in May 2022 as well as June 2024 having previously failed flecainide.  She was taken off amiodarone in October 2024 by Dr. Nelly Laurence and otherwise continues on Xarelto for stroke prophylaxis.  She is also on bisoprolol.  Currently doing well without interval palpitations or atrial fibrillation alerts from her Apple Watch.  She is in sinus rhythm by ECG today.  No changes were made.  2.  Primary hypertension.  Blood pressure is well-controlled today.  She continues on losartan.  3.  OSA, reports difficulty tolerating current mask with CPAP.  Encouraged her to keep follow-up pending with Dr. Vassie Loll for discussion of other treatment options.  4.  Coronary calcium score of 0 as of June 2024.  Disposition:  Follow up  1 year.  Signed, Jonelle Sidle, M.D., F.A.C.C.  [facility] at [facility]

## 2023-05-23 ENCOUNTER — Ambulatory Visit (HOSPITAL_BASED_OUTPATIENT_CLINIC_OR_DEPARTMENT_OTHER): Payer: Medicaid Other | Admitting: Pulmonary Disease

## 2023-05-29 ENCOUNTER — Encounter (HOSPITAL_BASED_OUTPATIENT_CLINIC_OR_DEPARTMENT_OTHER): Payer: Self-pay | Admitting: Pulmonary Disease

## 2023-05-29 NOTE — Telephone Encounter (Signed)
 Can you help with this?

## 2023-05-30 ENCOUNTER — Ambulatory Visit (HOSPITAL_BASED_OUTPATIENT_CLINIC_OR_DEPARTMENT_OTHER): Payer: Medicaid Other | Admitting: Pulmonary Disease

## 2023-06-02 ENCOUNTER — Encounter: Payer: Self-pay | Admitting: Cardiovascular Disease

## 2023-06-02 ENCOUNTER — Ambulatory Visit: Payer: Medicaid Other | Attending: Cardiovascular Disease | Admitting: Cardiovascular Disease

## 2023-06-02 VITALS — BP 120/74 | HR 64 | Ht 68.0 in | Wt 199.6 lb

## 2023-06-02 DIAGNOSIS — I4819 Other persistent atrial fibrillation: Secondary | ICD-10-CM

## 2023-06-02 NOTE — Patient Instructions (Signed)
Medication Instructions:  Continue all current medications.   Labwork: none  Testing/Procedures: none  Follow-Up: 6 months   Any Other Special Instructions Will Be Listed Below (If Applicable).   If you need a refill on your cardiac medications before your next appointment, please call your pharmacy.

## 2023-06-02 NOTE — Progress Notes (Signed)
   PCP: Rebecka Apley, NP Primary Cardiologist: Dr Diona Browner Primary EP: Dr Nelly Laurence  Dana Ortiz is a 53 y.o. female who presents today for routine electrophysiology followup.         S he has a history of longstanding persistent atrial fibrillation and underwent ablation by Dr. Johney Frame in May 2022.  He had recurrence of atrial fibrillation and underwent DC cardioversion in May 2024 that was accessible.  She underwent ablation for atrial fibrillation on October 05, 2022.  She had a small area of connection on the right superior pulmonary vein and significant breakthrough along the inferior posterior wall line that had been previously performed.  I then reinforced the prior pulmonary vein encirclement and posterior wall lesion sets.  She did have transient hypotension after the procedure, likely due to dehydration and anesthesia.  She has remained in normal sinus rhythm since the ablation.  She has not been well tolerating her CPAP mask and has an appoint with her pulmonologist to have it adjusted.      Today, she denies symptoms of palpitations, chest pain, shortness of breath,  lower extremity edema, dizziness, presyncope, or syncope.  The patient is otherwise without complaint today.    Physical Exam: Vitals:   06/02/23 0909  BP: 120/74  Pulse: 64  SpO2: 98%  Weight: 199 lb 9.6 oz (90.5 kg)  Height: 5\' 8"  (1.727 m)     Gen: Appears comfortable, well-nourished CV: RRR, no dependent edema Pulm: breathing easily   Wt Readings from Last 3 Encounters:  06/02/23 199 lb 9.6 oz (90.5 kg)  05/19/23 198 lb 3.2 oz (89.9 kg)  01/20/23 194 lb (88 kg)      Assessment and Plan:  Persistent afib Well controlled post ablation Previously failed flecainide No recurrence since ablation, off amiodarone If she has recurrence, I don't see why she wouldn't be a candidate to resume flecainide.  Secondary hypercoagulable state On xarelto 15 mg for chads2vasc score of 2    HTN Stable No change required today  Obstructive sleep apnea We discussed the importance of CPAP use for the maintenance of durable sinus rhythm  Follow-up with me or Dr Diona Browner in 6 months  Maurice Small, MD 06/02/2023 9:13 AM

## 2023-06-28 ENCOUNTER — Other Ambulatory Visit: Payer: Self-pay | Admitting: Cardiovascular Disease

## 2023-06-28 DIAGNOSIS — I4819 Other persistent atrial fibrillation: Secondary | ICD-10-CM

## 2023-06-28 NOTE — Telephone Encounter (Signed)
 Xarelto 20mg  refill request received. Pt is 53 years old, weight-90.5kg, Crea-1.07 on 10/03/22, last seen by Dr. Nelly Laurence on 06/02/23, Diagnosis-Afib, CrCl-85.87 mL/min; Dose is appropriate based on dosing criteria. Will send in refill to requested pharmacy.

## 2023-07-01 ENCOUNTER — Encounter: Payer: Self-pay | Admitting: Cardiovascular Disease

## 2023-07-03 ENCOUNTER — Encounter: Payer: Self-pay | Admitting: Cardiovascular Disease

## 2023-07-03 NOTE — Telephone Encounter (Signed)
 Attempted to contact patient back, left message to call our office. She already has an appointment with Ricky at the [facility] 07/04/23 - see other My Chart message thread from today 3/17

## 2023-07-04 ENCOUNTER — Ambulatory Visit: Admitting: Pulmonary Disease

## 2023-07-04 ENCOUNTER — Ambulatory Visit (HOSPITAL_COMMUNITY)
Admission: RE | Admit: 2023-07-04 | Discharge: 2023-07-04 | Disposition: A | Discharge status: Home / Self Care | Source: Ambulatory Visit | Attending: Physician Assistant | Admitting: Physician Assistant

## 2023-07-04 ENCOUNTER — Encounter (HOSPITAL_COMMUNITY): Payer: Self-pay | Admitting: Physician Assistant

## 2023-07-04 VITALS — BP 88/60 | HR 96 | Ht 68.0 in | Wt 198.4 lb

## 2023-07-04 DIAGNOSIS — I10 Essential (primary) hypertension: Secondary | ICD-10-CM | POA: Insufficient documentation

## 2023-07-04 DIAGNOSIS — Z7901 Long term (current) use of anticoagulants: Secondary | ICD-10-CM | POA: Diagnosis not present

## 2023-07-04 DIAGNOSIS — I340 Nonrheumatic mitral (valve) insufficiency: Secondary | ICD-10-CM | POA: Diagnosis not present

## 2023-07-04 DIAGNOSIS — I4811 Longstanding persistent atrial fibrillation: Secondary | ICD-10-CM | POA: Diagnosis not present

## 2023-07-04 DIAGNOSIS — D6869 Other thrombophilia: Secondary | ICD-10-CM | POA: Insufficient documentation

## 2023-07-04 DIAGNOSIS — G4733 Obstructive sleep apnea (adult) (pediatric): Secondary | ICD-10-CM | POA: Diagnosis not present

## 2023-07-04 DIAGNOSIS — I4891 Unspecified atrial fibrillation: Secondary | ICD-10-CM | POA: Diagnosis present

## 2023-07-04 DIAGNOSIS — I4819 Other persistent atrial fibrillation: Secondary | ICD-10-CM

## 2023-07-04 LAB — CBC
HCT: 43.7 % (ref 36.0–46.0)
Hemoglobin: 14.6 g/dL (ref 12.0–15.0)
MCH: 29.1 pg (ref 26.0–34.0)
MCHC: 33.4 g/dL (ref 30.0–36.0)
MCV: 87.1 fL (ref 80.0–100.0)
Platelets: 250 10*3/uL (ref 150–400)
RBC: 5.02 MIL/uL (ref 3.87–5.11)
RDW: 12.2 % (ref 11.5–15.5)
WBC: 6.5 10*3/uL (ref 4.0–10.5)
nRBC: 0 % (ref 0.0–0.2)

## 2023-07-04 LAB — BASIC METABOLIC PANEL
Anion gap: 3 — ABNORMAL LOW (ref 5–15)
BUN: 16 mg/dL (ref 6–20)
CO2: 26 mmol/L (ref 22–32)
Calcium: 9 mg/dL (ref 8.9–10.3)
Chloride: 107 mmol/L (ref 98–111)
Creatinine, Ser: 0.84 mg/dL (ref 0.44–1.00)
GFR, Estimated: >60 mL/min (ref >60)
Glucose, Bld: 123 mg/dL — ABNORMAL HIGH (ref 70–99)
Potassium: 3.8 mmol/L (ref 3.5–5.1)
Sodium: 136 mmol/L (ref 135–145)

## 2023-07-04 MED ORDER — FLECAINIDE ACETATE 100 MG PO TABS: 100.0000 mg | ORAL_TABLET | Freq: Two times a day (BID) | ORAL | 3 refills | Status: DC

## 2023-07-04 NOTE — Progress Notes (Signed)
 Primary Care Physician: Rebecka Apley, NP Primary Cardiologist: Dr Diona Browner Primary Electrophysiologist: Dr Nelly Laurence  Referring Physician: Dr Alvan Dame Dana Ortiz is a 53 y.o. female with a history of HTN, OSA, and atrial fibrillation who presents for follow up in the [facility]. Patient is on Xarelto for a CHADS2VASC score of 2. She was seen by Dr Diona Browner on 05/12/20 and was believed to be in persistent atrial fibrillation for several months at least, if not longer, per the patient's Apple Watch. She does have symptoms of fatigue and dyspnea with activity while in afib. She has been intolerant of metoprolol and diltiazem in the past due to side effects. She denies significant alcohol use. Patient is s/p DCCV on 07/06/20 with quick return of her afib just under two days later. She does report she felt "great" for those two days which much more energy. Patient is s/p afib ablation 09/12/20.   Patient reported on 06/25/22 after work she had symptoms of palpitations, SOB, dizziness, and chest heaviness. Her smart watch showed afib. This is her first episode since her ablation in 2022. She deferred DCCV until she could get started on CPAP for her severe OSA.  Patient is s/p DCCV on 08/22/22 but quickly returned to afib. She is s/p Afib ablation on 10/05/22 by Dr. Nelly Laurence.   Patient returns for follow up for atrial fibrillation. She reports that she went back into afib on 07/01/23 with symptoms of fatigue, SOB, and chest discomfort. There were no specific triggers that she could identify. No bleeding issues on anticoagulation.   Today, he denies symptoms of orthopnea, PND, lower extremity edema, dizziness, presyncope, syncope, bleeding, or neurologic sequela. The patient is tolerating medications without difficulties and is otherwise without complaint today.    Atrial Fibrillation Risk Factors:  she does have symptoms or diagnosis of sleep apnea. she does not have  a history of rheumatic fever. she does not have a history of alcohol use. The patient does not have a history of early familial atrial fibrillation or other arrhythmias.   Atrial Fibrillation Management history:  Previous antiarrhythmic drugs: flecainide, amiodarone  Previous cardioversions: 07/06/20 Previous ablations: 09/12/20, 10/05/22 Anticoagulation history: Xarelto   Past Medical History:  Diagnosis Date   ADHD (attention deficit hyperactivity disorder)    Anxiety    Asthma    Essential hypertension    Paroxysmal atrial fibrillation (HCC)    TMJ arthralgia    Current Outpatient Medications  Medication Sig Dispense Refill   albuterol (VENTOLIN HFA) 108 (90 Base) MCG/ACT inhaler Inhale 2 puffs into the lungs every 4 (four) hours as needed for wheezing or shortness of breath.     amphetamine-dextroamphetamine (ADDERALL) 20 MG tablet Take 20 mg by mouth every 8 (eight) hours.     bisoprolol (ZEBETA) 5 MG tablet Take 1 tablet (5 mg total) by mouth daily. 90 tablet 1   celecoxib (CELEBREX) 200 MG capsule Take 200 mg by mouth daily as needed (for pain).     citalopram (CELEXA) 20 MG tablet Take 20 mg by mouth daily.     EPINEPHrine 0.3 mg/0.3 mL IJ SOAJ injection Inject 0.3 mg into the muscle as needed for anaphylaxis.     fluticasone (FLONASE) 50 MCG/ACT nasal spray Place 1 spray into both nostrils daily as needed for allergies or rhinitis.     HYDROcodone-acetaminophen (NORCO/VICODIN) 5-325 MG tablet Take 1-2 tablets by mouth every 6 (six) hours as needed for moderate pain (pain score 4-6).  losartan (COZAAR) 50 MG tablet Take 25 mg ( 0.5 tablet) daily. If BP starts to rise to > 120 for the top number, can go back to previous dose of 50 mg daily.     Multiple Vitamin (MULTIVITAMIN) tablet Take 1 tablet by mouth daily.     NON FORMULARY Take 3 capsules by mouth See admin instructions. Lion's Mane mushroom capsules- Take 3 capsules by mouth every morning     NON FORMULARY daily at 6  (six) AM. Black cohosh     rivaroxaban (XARELTO) 20 MG TABS tablet TAKE 1 TABLET BY MOUTH DAILY WITH SUPPER 90 tablet 1   SYMBICORT 80-4.5 MCG/ACT inhaler Inhale 2 puffs into the lungs daily.     Vitamin D, Ergocalciferol, 50000 units CAPS Take 1 capsule by mouth once a week.     No current facility-administered medications for this encounter.     ROS- All systems are reviewed and negative except as per the HPI above.  Physical Exam: Vitals:   07/04/23 0841  BP: (!) 88/60  Pulse: 96  Weight: 90 kg  Height: 5\' 8"  (1.727 m)    GEN: Well nourished, well developed in no acute distress CARDIAC: Irregularly irregular rate and rhythm, no murmurs, rubs, gallops RESPIRATORY:  Clear to auscultation without rales, wheezing or rhonchi  ABDOMEN: Soft, non-tender, non-distended EXTREMITIES:  No edema; No deformity    Wt Readings from Last 3 Encounters:  07/04/23 90 kg  06/02/23 90.5 kg  05/19/23 89.9 kg    EKG today demonstrates  Afib, slow R wave prog Vent. rate 96 BPM PR interval * ms QRS duration 88 ms QT/QTcB 330/416 ms   Echo 07/01/20 demonstrated  1. Left ventricular ejection fraction, by estimation, is approximately  50%. The left ventricle has low normal function. The left ventricle  demonstrates global hypokinesis. There is mild left ventricular  hypertrophy. Left ventricular diastolic parameters are indeterminate.   2. Right ventricular systolic function is normal. The right ventricular  size is normal. There is normal pulmonary artery systolic pressure. The  estimated right ventricular systolic pressure is 22.0 mmHg.   3. Left atrial size was moderately to severely dilated.   4. Right atrial size was upper normal.   5. There is a trivial pericardial effusion posterior to the left  ventricle.   6. The mitral valve is grossly normal. Mild to moderate mitral valve  regurgitation made up of two jets.   7. The aortic valve is tricuspid. Aortic valve regurgitation is not   visualized.   8. The inferior vena cava is normal in size with greater than 50%  respiratory variability, suggesting right atrial pressure of 3 mmHg.   9. Cannot exclude small left to right interatrial shunt, possible PFO.   Comparison(s): Echocardiogram done 09/29/16 showed an EF of 55-60%.   Epic records are reviewed at length today  CHA2DS2-VASc Score = 2  The patient's score is based upon: CHF History: 0 HTN History: 1 Diabetes History: 0 Stroke History: 0 Vascular Disease History: 0 Age Score: 0 Gender Score: 1       ASSESSMENT AND PLAN: Longstanding Persistent Atrial Fibrillation (ICD10:  I48.11) The patient's CHA2DS2-VASc score is 2, indicating a 2.2% annual risk of stroke.   S/p afib ablation 09/12/20 and 10/05/22 Patient back in symptomatic afib. We discussed rhythm control options. Will resume flecainide 100 mg BID and schedule DCCV. Long term, patient interested in PFA, ? If she would be a candidate. Will d/w Dr Nelly Laurence.  Check bmet/cbc  Continue Xarelto 20 mg daily Continue bisoprolol 5 mg daily   OSA  Severe on HST Encouraged nightly CPAP Severe on HST Followed by Dr Vassie Loll  HTN Low today, hold Losartan until post DCCV.   VHD Mild to moderate MR Followed by Dr Diona Browner    Follow up in the AF clinic post DCCV.    Informed Consent   Shared Decision Making/Informed Consent The risks (stroke, cardiac arrhythmias rarely resulting in the need for a temporary or permanent pacemaker, skin irritation or burns and complications associated with conscious sedation including aspiration, arrhythmia, respiratory failure and death), benefits (restoration of normal sinus rhythm) and alternatives of a direct current cardioversion were explained in detail to Ms. Papp and she agrees to proceed.        Jorja Loa PA-C [facility] [address] [place], [place] [address] [phone] 07/04/2023 8:48 AM

## 2023-07-04 NOTE — Patient Instructions (Signed)
 Hold losartan until after cardioversion  Start Flecainide 100mg  twice a day   Cardioversion scheduled for: Thursday, March 20th   - Arrive at the [facility] and go to admitting at 1030am   - Do not eat or drink anything after midnight the night prior to your procedure.   - Take all your morning medication (except diabetic medications) with a sip of water prior to arrival.  - You will not be able to drive home after your procedure.    - Do NOT miss any doses of your blood thinner - if you should miss a dose please notify our office immediately.   - If you feel as if you go back into normal rhythm prior to scheduled cardioversion, please notify our office immediately.   If your procedure is canceled in the cardioversion suite you will be charged a cancellation fee.

## 2023-07-05 ENCOUNTER — Encounter (HOSPITAL_COMMUNITY): Payer: Self-pay

## 2023-07-05 NOTE — Progress Notes (Signed)
 Spoke to patient's SO, Chrissie Noa and instructed them to come at 1130 and to be NPO after 0000.  Medications reviewed.    Confirmed that patient will have a ride home and someone to stay with them for 24 hours after the procedure.

## 2023-07-06 ENCOUNTER — Ambulatory Visit (HOSPITAL_COMMUNITY): Admitting: Anesthesiology

## 2023-07-06 ENCOUNTER — Ambulatory Visit (HOSPITAL_COMMUNITY)
Admission: RE | Admit: 2023-07-06 | Discharge: 2023-07-06 | Disposition: A | Discharge status: Home / Self Care | Attending: Cardiology | Admitting: Cardiology

## 2023-07-06 ENCOUNTER — Encounter (HOSPITAL_COMMUNITY): Payer: Self-pay | Admitting: Cardiology

## 2023-07-06 ENCOUNTER — Inpatient Hospital Stay (HOSPITAL_COMMUNITY)
Admission: RE | Admit: 2023-07-06 | Discharge: 2023-07-06 | Disposition: A | Discharge status: Home / Self Care | Source: Ambulatory Visit | Attending: Physician Assistant | Admitting: Physician Assistant

## 2023-07-06 ENCOUNTER — Encounter (HOSPITAL_COMMUNITY)
Admission: RE | Disposition: A | Discharge status: Home / Self Care | Payer: Self-pay | Source: Home / Self Care | Attending: Cardiology

## 2023-07-06 ENCOUNTER — Other Ambulatory Visit: Payer: Self-pay

## 2023-07-06 DIAGNOSIS — G4733 Obstructive sleep apnea (adult) (pediatric): Secondary | ICD-10-CM | POA: Diagnosis not present

## 2023-07-06 DIAGNOSIS — I10 Essential (primary) hypertension: Secondary | ICD-10-CM | POA: Diagnosis not present

## 2023-07-06 DIAGNOSIS — I4811 Longstanding persistent atrial fibrillation: Secondary | ICD-10-CM | POA: Insufficient documentation

## 2023-07-06 DIAGNOSIS — I4891 Unspecified atrial fibrillation: Secondary | ICD-10-CM

## 2023-07-06 DIAGNOSIS — I4819 Other persistent atrial fibrillation: Secondary | ICD-10-CM

## 2023-07-06 DIAGNOSIS — J45909 Unspecified asthma, uncomplicated: Secondary | ICD-10-CM | POA: Insufficient documentation

## 2023-07-06 DIAGNOSIS — Z7901 Long term (current) use of anticoagulants: Secondary | ICD-10-CM | POA: Insufficient documentation

## 2023-07-06 DIAGNOSIS — Z79899 Other long term (current) drug therapy: Secondary | ICD-10-CM | POA: Insufficient documentation

## 2023-07-06 DIAGNOSIS — F419 Anxiety disorder, unspecified: Secondary | ICD-10-CM | POA: Diagnosis not present

## 2023-07-06 HISTORY — PX: CARDIOVERSION: EP1203

## 2023-07-06 SURGERY — CARDIOVERSION (CATH LAB)
Anesthesia: General

## 2023-07-06 MED ORDER — PROPOFOL 10 MG/ML IV BOLUS
INTRAVENOUS | Status: DC | PRN
  Administered 2023-07-06: 70 mg via INTRAVENOUS

## 2023-07-06 MED ORDER — LIDOCAINE 2% (20 MG/ML) 5 ML SYRINGE
INTRAMUSCULAR | Status: DC | PRN
  Administered 2023-07-06: 50 mg via INTRAVENOUS

## 2023-07-06 MED ORDER — SODIUM CHLORIDE 0.9% FLUSH
3.0000 mL | Freq: Two times a day (BID) | INTRAVENOUS | Status: DC
  Administered 2023-07-06: 10 mL via INTRAVENOUS

## 2023-07-06 MED ORDER — SODIUM CHLORIDE 0.9% FLUSH: 3.0000 mL | INTRAVENOUS | Status: DC | PRN

## 2023-07-06 SURGICAL SUPPLY — 1 items: PAD DEFIB RADIO PHYSIO CONN (PAD) ×1 IMPLANT

## 2023-07-06 NOTE — Transfer of Care (Signed)
 Immediate Anesthesia Transfer of Care Note  Patient: Dana Ortiz  Procedure(s) Performed: CARDIOVERSION  Patient Location: Cath Lab  Anesthesia Type:General  Level of Consciousness: drowsy  Airway & Oxygen Therapy: Patient Spontanous Breathing and Patient connected to nasal cannula oxygen  Post-op Assessment: Report given to RN and Post -op Vital signs reviewed and stable  Post vital signs: Reviewed and stable  Last Vitals:  Vitals Value Taken Time  BP    Temp    Pulse    Resp    SpO2      Last Pain:  Vitals:   07/06/23 1125  PainSc: 0-No pain         Complications: No notable events documented.

## 2023-07-06 NOTE — Anesthesia Preprocedure Evaluation (Addendum)
 Anesthesia Evaluation  Patient identified by MRN, date of birth, ID band Patient awake    Reviewed: Allergy & Precautions, NPO status , Patient's Chart, lab work & pertinent test results, reviewed documented beta blocker date and time   Airway Mallampati: I  TM Distance: >3 FB Neck ROM: Full    Dental no notable dental hx. (+) Teeth Intact, Dental Advisory Given   Pulmonary asthma , sleep apnea    Pulmonary exam normal breath sounds clear to auscultation       Cardiovascular hypertension, Pt. on home beta blockers and Pt. on medications Normal cardiovascular exam+ dysrhythmias (xarelto) Atrial Fibrillation  Rhythm:Irregular Rate:Normal  TEE 2023  1. Probable prolapse of posterior MV leaflet (P3); however MR is moderate  and appears central.   2. Left ventricular ejection fraction, by estimation, is 55 to 60%. The  left ventricle has normal function.   3. Right ventricular systolic function is normal. The right ventricular  size is normal.   4. Left atrial size was moderately dilated. No left atrial/left atrial  appendage thrombus was detected.   5. Right atrial size was mildly dilated.   6. The mitral valve is abnormal. Moderate mitral valve regurgitation.  There is mild holosystolic prolapse of posterior leaflet of the mitral  valve.   7. The aortic valve is normal in structure. Aortic valve regurgitation is  not visualized.   8. There is mild (Grade II) plaque involving the descending aorta.      Neuro/Psych  PSYCHIATRIC DISORDERS Anxiety     negative neurological ROS     GI/Hepatic negative GI ROS, Neg liver ROS,,,  Endo/Other  negative endocrine ROS    Renal/GU negative Renal ROS  negative genitourinary   Musculoskeletal negative musculoskeletal ROS (+)    Abdominal   Peds  Hematology negative hematology ROS (+)   Anesthesia Other Findings   Reproductive/Obstetrics                              Anesthesia Physical Anesthesia Plan  ASA: 3  Anesthesia Plan: General   Post-op Pain Management:    Induction: Intravenous  PONV Risk Score and Plan: Propofol infusion and Treatment may vary due to age or medical condition  Airway Management Planned: Natural Airway  Additional Equipment:   Intra-op Plan:   Post-operative Plan:   Informed Consent: I have reviewed the patients History and Physical, chart, labs and discussed the procedure including the risks, benefits and alternatives for the proposed anesthesia with the patient or authorized representative who has indicated his/her understanding and acceptance.     Dental advisory given  Plan Discussed with: CRNA  Anesthesia Plan Comments:        Anesthesia Quick Evaluation

## 2023-07-06 NOTE — H&P (Signed)
 ATRIAL FIB OFFICE VISIT 07/04/2023 [name] at [facility]    [place], [place], [place] [facility] Longstanding persistent atrial fibrillation [facility]) +2 more Dx Referred by Rebecka Apley, NP Reason for Visit   Additional Documentation  Vitals: BP 88/60 Important    Pulse 96   Ht 5\' 8"  (1.727 m)   Wt 90 kg   BMI 30.17 kg/m   BSA 2.08 m  Flowsheets: CHADS2-VASc Score,   NEWS,   MEWS Score,   Vital Signs,   Anthropometrics,   Method of Visit  Encounter Info: Billing Info,   History,   Allergies,   Detailed Report   All Notes   Progress Notes by Danice Goltz, PA at 07/04/2023 8:30 AM  Author: Danice Goltz, PA Author Type: Physician Assistant Filed: 07/04/2023 10:11 AM  Note Status: Signed Cosign: Cosign Not Required Date of Service: 07/04/2023  8:30 AM  Editor: Danice Goltz, PA (Physician Assistant)             Expand All Collapse All      Primary Care Physician: Rebecka Apley, NP Primary Cardiologist: Dr Diona Browner Primary Electrophysiologist: Dr Nelly Laurence  Referring Physician: Dr Alvan Dame Dana Ortiz is a 53 y.o. female with a history of HTN, OSA, and atrial fibrillation who presents for follow up in the [facility]. Patient is on Xarelto for a CHADS2VASC score of 2. She was seen by Dr Diona Browner on 05/12/20 and was believed to be in persistent atrial fibrillation for several months at least, if not longer, per the patient's Apple Watch. She does have symptoms of fatigue and dyspnea with activity while in afib. She has been intolerant of metoprolol and diltiazem in the past due to side effects. She denies significant alcohol use. Patient is s/p DCCV on 07/06/20 with quick return of her afib just under two days later. She does report she felt "great" for those two days which much more energy. Patient is s/p afib ablation 09/12/20.    Patient reported on 06/25/22 after work she had  symptoms of palpitations, SOB, dizziness, and chest heaviness. Her smart watch showed afib. This is her first episode since her ablation in 2022. She deferred DCCV until she could get started on CPAP for her severe OSA.   Patient is s/p DCCV on 08/22/22 but quickly returned to afib. She is s/p Afib ablation on 10/05/22 by Dr. Nelly Laurence.    Patient returns for follow up for atrial fibrillation. She reports that she went back into afib on 07/01/23 with symptoms of fatigue, SOB, and chest discomfort. There were no specific triggers that she could identify. No bleeding issues on anticoagulation.    Today, he denies symptoms of orthopnea, PND, lower extremity edema, dizziness, presyncope, syncope, bleeding, or neurologic sequela. The patient is tolerating medications without difficulties and is otherwise without complaint today.      Atrial Fibrillation Risk Factors:   she does have symptoms or diagnosis of sleep apnea. she does not have a history of rheumatic fever. she does not have a history of alcohol use. The patient does not have a history of early familial atrial fibrillation or other arrhythmias.     Atrial Fibrillation Management history:   Previous antiarrhythmic drugs: flecainide, amiodarone  Previous cardioversions: 07/06/20 Previous ablations: 09/12/20, 10/05/22 Anticoagulation history: Xarelto         Past Medical History:  Diagnosis Date   ADHD (attention deficit hyperactivity disorder)  Anxiety     Asthma     Essential hypertension     Paroxysmal atrial fibrillation (HCC)     TMJ arthralgia                Current Outpatient Medications  Medication Sig Dispense Refill   albuterol (VENTOLIN HFA) 108 (90 Base) MCG/ACT inhaler Inhale 2 puffs into the lungs every 4 (four) hours as needed for wheezing or shortness of breath.       amphetamine-dextroamphetamine (ADDERALL) 20 MG tablet Take 20 mg by mouth every 8 (eight) hours.       bisoprolol (ZEBETA) 5 MG tablet Take 1 tablet  (5 mg total) by mouth daily. 90 tablet 1   celecoxib (CELEBREX) 200 MG capsule Take 200 mg by mouth daily as needed (for pain).       citalopram (CELEXA) 20 MG tablet Take 20 mg by mouth daily.       EPINEPHrine 0.3 mg/0.3 mL IJ SOAJ injection Inject 0.3 mg into the muscle as needed for anaphylaxis.       fluticasone (FLONASE) 50 MCG/ACT nasal spray Place 1 spray into both nostrils daily as needed for allergies or rhinitis.       HYDROcodone-acetaminophen (NORCO/VICODIN) 5-325 MG tablet Take 1-2 tablets by mouth every 6 (six) hours as needed for moderate pain (pain score 4-6).       losartan (COZAAR) 50 MG tablet Take 25 mg ( 0.5 tablet) daily. If BP starts to rise to > 120 for the top number, can go back to previous dose of 50 mg daily.       Multiple Vitamin (MULTIVITAMIN) tablet Take 1 tablet by mouth daily.       NON FORMULARY Take 3 capsules by mouth See admin instructions. Lion's Mane mushroom capsules- Take 3 capsules by mouth every morning       NON FORMULARY daily at 6 (six) AM. Black cohosh       rivaroxaban (XARELTO) 20 MG TABS tablet TAKE 1 TABLET BY MOUTH DAILY WITH SUPPER 90 tablet 1   SYMBICORT 80-4.5 MCG/ACT inhaler Inhale 2 puffs into the lungs daily.       Vitamin D, Ergocalciferol, 50000 units CAPS Take 1 capsule by mouth once a week.          No current facility-administered medications for this encounter.          ROS- All systems are reviewed and negative except as per the HPI above.   Physical Exam:    Vitals:    07/04/23 0841  BP: (!) 88/60  Pulse: 96  Weight: 90 kg  Height: 5\' 8"  (1.727 m)      GEN: Well nourished, well developed in no acute distress CARDIAC: Irregularly irregular rate and rhythm, no murmurs, rubs, gallops RESPIRATORY:  Clear to auscultation without rales, wheezing or rhonchi  ABDOMEN: Soft, non-tender, non-distended EXTREMITIES:  No edema; No deformity         Wt Readings from Last 3 Encounters:  07/04/23 90 kg  06/02/23 90.5 kg   05/19/23 89.9 kg      EKG today demonstrates  Afib, slow R wave prog Vent. rate 96 BPM PR interval * ms QRS duration 88 ms QT/QTcB 330/416 ms     Echo 07/01/20 demonstrated  1. Left ventricular ejection fraction, by estimation, is approximately  50%. The left ventricle has low normal function. The left ventricle  demonstrates global hypokinesis. There is mild left ventricular  hypertrophy. Left ventricular diastolic parameters are indeterminate.  2. Right ventricular systolic function is normal. The right ventricular  size is normal. There is normal pulmonary artery systolic pressure. The  estimated right ventricular systolic pressure is 22.0 mmHg.   3. Left atrial size was moderately to severely dilated.   4. Right atrial size was upper normal.   5. There is a trivial pericardial effusion posterior to the left  ventricle.   6. The mitral valve is grossly normal. Mild to moderate mitral valve  regurgitation made up of two jets.   7. The aortic valve is tricuspid. Aortic valve regurgitation is not  visualized.   8. The inferior vena cava is normal in size with greater than 50%  respiratory variability, suggesting right atrial pressure of 3 mmHg.   9. Cannot exclude small left to right interatrial shunt, possible PFO.   Comparison(s): Echocardiogram done 09/29/16 showed an EF of 55-60%.    Epic records are reviewed at length today   CHA2DS2-VASc Score = 2  The patient's score is based upon: CHF History: 0 HTN History: 1 Diabetes History: 0 Stroke History: 0 Vascular Disease History: 0 Age Score: 0 Gender Score: 1         ASSESSMENT AND PLAN: Longstanding Persistent Atrial Fibrillation (ICD10:  I48.11) The patient's CHA2DS2-VASc score is 2, indicating a 2.2% annual risk of stroke.   S/p afib ablation 09/12/20 and 10/05/22 Patient back in symptomatic afib. We discussed rhythm control options. Will resume flecainide 100 mg BID and schedule DCCV. Long term, patient  interested in PFA, ? If she would be a candidate. Will d/w Dr Nelly Laurence.  Check bmet/cbc Continue Xarelto 20 mg daily Continue bisoprolol 5 mg daily   OSA  Severe on HST Encouraged nightly CPAP Severe on HST Followed by Dr Vassie Loll   HTN Low today, hold Losartan until post DCCV.    VHD Mild to moderate MR Followed by Dr Diona Browner       Follow up in the AF clinic post DCCV.      Informed Consent Shared Decision Making/Informed Consent The risks (stroke, cardiac arrhythmias rarely resulting in the need for a temporary or permanent pacemaker, skin irritation or burns and complications associated with conscious sedation including aspiration, arrhythmia, respiratory failure and death), benefits (restoration of normal sinus rhythm) and alternatives of a direct current cardioversion were explained in detail to Dana Ortiz and she agrees to proceed.            Jorja Loa PA-C [facility] [address] [place], [place] [address] [phone] 07/04/2023 8:48 AM      For DCCV; compliant with xarelto; no changes Dana Ortiz

## 2023-07-06 NOTE — Procedures (Signed)
 Electrical Cardioversion Procedure Note Dana Ortiz [id number] 08-05-1970  Procedure: Electrical Cardioversion Indications:  Atrial Fibrillation  Procedure Details Consent: Risks of procedure as well as the alternatives and risks of each were explained to the (patient/caregiver).  Consent for procedure obtained. Time Out: Verified patient identification, verified procedure, site/side was marked, verified correct patient position, special equipment/implants available, medications/allergies/relevent history reviewed, required imaging and test results available.  Performed  Patient placed on cardiac monitor, pulse oximetry, supplemental oxygen as necessary.  Sedation given:  Pt sedated by anesthesia with lidocaine 70 mg and diprovan 50 mg IV. Pacer pads placed anterior and posterior chest.  Cardioverted 1 time(s).  Cardioverted at 300J.  Evaluation Findings: Post procedure EKG shows: NSR Complications: None Patient did tolerate procedure well.   Dana Ortiz 07/06/2023, 11:23 AM

## 2023-07-06 NOTE — Interval H&P Note (Signed)
 History and Physical Interval Note:  07/06/2023 11:27 AM  Dana Ortiz  has presented today for surgery, with the diagnosis of afib.  The various methods of treatment have been discussed with the patient and family. After consideration of risks, benefits and other options for treatment, the patient has consented to  Procedure(s): CARDIOVERSION (N/A) as a surgical intervention.  The patient's history has been reviewed, patient examined, no change in status, stable for surgery.  I have reviewed the patient's chart and labs.  Questions were answered to the patient's satisfaction.     [name]

## 2023-07-07 ENCOUNTER — Encounter (HOSPITAL_COMMUNITY): Payer: Self-pay | Admitting: Cardiology

## 2023-07-07 NOTE — Anesthesia Postprocedure Evaluation (Signed)
 Anesthesia Post Note  Patient: Dana Ortiz  Procedure(s) Performed: CARDIOVERSION     Patient location during evaluation: Cath Lab Anesthesia Type: General Level of consciousness: awake and alert Pain management: pain level controlled Vital Signs Assessment: post-procedure vital signs reviewed and stable Respiratory status: spontaneous breathing and respiratory function stable Cardiovascular status: blood pressure returned to baseline and stable Postop Assessment: spinal receding Anesthetic complications: no  No notable events documented.  Last Vitals:  Vitals:   07/06/23 1210 07/06/23 1230  BP: 109/70 124/80  Pulse:    Resp: 18 18  Temp:    SpO2: 95% 96%    Last Pain:  Vitals:   07/06/23 1125  PainSc: 0-No pain                 Pegah Segel L Terriyah Westra

## 2023-07-10 ENCOUNTER — Encounter (HOSPITAL_COMMUNITY): Payer: Self-pay

## 2023-07-10 DIAGNOSIS — M4727 Other spondylosis with radiculopathy, lumbosacral region: Secondary | ICD-10-CM | POA: Insufficient documentation

## 2023-07-10 DIAGNOSIS — M5416 Radiculopathy, lumbar region: Secondary | ICD-10-CM | POA: Insufficient documentation

## 2023-07-11 ENCOUNTER — Encounter (HOSPITAL_BASED_OUTPATIENT_CLINIC_OR_DEPARTMENT_OTHER): Payer: Self-pay | Admitting: Primary Care

## 2023-07-11 ENCOUNTER — Ambulatory Visit (INDEPENDENT_AMBULATORY_CARE_PROVIDER_SITE_OTHER): Admitting: Primary Care

## 2023-07-11 VITALS — BP 122/70 | HR 68 | Ht 68.0 in | Wt 197.2 lb

## 2023-07-11 DIAGNOSIS — J4530 Mild persistent asthma, uncomplicated: Secondary | ICD-10-CM

## 2023-07-11 DIAGNOSIS — G4733 Obstructive sleep apnea (adult) (pediatric): Secondary | ICD-10-CM

## 2023-07-11 DIAGNOSIS — I4811 Longstanding persistent atrial fibrillation: Secondary | ICD-10-CM | POA: Diagnosis not present

## 2023-07-11 MED ORDER — AZELASTINE HCL 0.1 % NA SOLN: 1.0000 | Freq: Two times a day (BID) | NASAL | 2 refills | Status: AC

## 2023-07-11 MED ORDER — LEVALBUTEROL TARTRATE 45 MCG/ACT IN AERO: 2.0000 | INHALATION_SPRAY | Freq: Four times a day (QID) | RESPIRATORY_TRACT | Status: DC | PRN

## 2023-07-11 NOTE — Progress Notes (Signed)
 @Patient  ID: Dana Ortiz, female    DOB: [birth year], 53 y.o.   MRN: [id number]  Chief Complaint  Patient presents with   Follow-up    Obstructive sleep apnea, Asthma    Referring provider: Rebecka Apley, NP  HPI: 53 year old female, never smoked. PMH significant for OSA, PAF s/p DCCV, asthma, ADD, TMJ. Patient of Dr. Vassie Loll, last seen on 09/01/22.   07/11/2023 Discussed the use of AI scribe software for clinical note transcription with the patient, who gave verbal consent to proceed.  History of Present Illness   Dana Ortiz is a 53 year old female with sleep apnea and asthma who presents with issues related to CPAP intolerance and asthma management.  During last visit pressure settings were backed from set pressure of 11cm h20 to auto pressure 8-13cm h20. She has not used her CPAP since July of last year due to discomfort, including nasal dryness and phlegm accumulation in her throat, which disrupts her sleep. Financial issues with Medicaid coverage have resulted in a $900 bill for the CPAP equipment, making her hesitant to seek further assistance for a better-fitting mask. A sleep study in January 2024 indicated severe sleep apnea with 36 apneic events per hour.  Her asthma symptoms have worsened, particularly with current allergies. She uses an albuterol inhaler daily, azelastine nasal spray, and Symbicort at night. She uses albuterol twice a day, which she knows is not ideal for her heart condition. She uses Symbicort every night but not in the morning, as it makes her sleepy, currently using one puff at night.  She has a history of atrial fibrillation and recently underwent a cardioversion March 20th. She is concerned about the impact of her sleep apnea on her overall health, particularly in relation to her atrial fibrillation.      Significant tests/ events reviewed  04/2022 HST >> AHI 36/h, low sat 70% 05/2020 CTA chest: b/l multifocal areas of soft tissue and GGO  nodularity, mosaic attenuation pattern 07/01/2020 echo: EF 50%, mild LVH, RVSP 22 mmHg, mod/severe LA dilation   Allergies  Allergen Reactions   Morphine And Codeine Anaphylaxis and Other (See Comments)    Can tolerate orally- did not tolerate when it was introduced via IV     Immunization History  Administered Date(s) Administered   Influenza-Unspecified 02/20/2014, 03/31/2015, 12/18/2019, 02/10/2020, 01/30/2022   PNEUMOCOCCAL CONJUGATE-20 03/30/2022   Pneumococcal Polysaccharide-23 11/02/2012   Tdap 11/03/2010   Zoster Recombinant(Shingrix) 12/18/2019, 05/26/2020    Past Medical History:  Diagnosis Date   ADHD (attention deficit hyperactivity disorder)    Anxiety    Asthma    Essential hypertension    Paroxysmal atrial fibrillation (HCC)    TMJ arthralgia     Tobacco History: Social History   Tobacco Use  Smoking Status Never  Smokeless Tobacco Never  Tobacco Comments   Never smoke 06/27/22   Counseling given: Not Answered Tobacco comments: Never smoke 06/27/22   Outpatient Medications Prior to Visit  Medication Sig Dispense Refill   albuterol (VENTOLIN HFA) 108 (90 Base) MCG/ACT inhaler Inhale 2 puffs into the lungs every 4 (four) hours as needed for wheezing or shortness of breath.     amphetamine-dextroamphetamine (ADDERALL) 20 MG tablet Take 20 mg by mouth 3 (three) times daily.     bisoprolol (ZEBETA) 5 MG tablet Take 1 tablet (5 mg total) by mouth daily. (Patient taking differently: Take 2.5 mg by mouth daily.) 90 tablet 1   BLACK COHOSH PO Take 3 tablets  by mouth 2 (two) times daily.     celecoxib (CELEBREX) 200 MG capsule Take 200 mg by mouth daily.     citalopram (CELEXA) 20 MG tablet Take 20 mg by mouth daily.     EPINEPHrine 0.3 mg/0.3 mL IJ SOAJ injection Inject 0.3 mg into the muscle as needed for anaphylaxis.     estradiol (ESTRACE) 0.1 MG/GM vaginal cream Place 1 Applicatorful vaginally once a week.     flecainide (TAMBOCOR) 100 MG tablet Take 1  tablet (100 mg total) by mouth 2 (two) times daily. 60 tablet 3   fluticasone (FLONASE) 50 MCG/ACT nasal spray Place 1 spray into both nostrils daily as needed for allergies or rhinitis.     HYDROcodone-acetaminophen (NORCO/VICODIN) 5-325 MG tablet Take 1 tablet by mouth daily.     losartan (COZAAR) 50 MG tablet Take 25 mg ( 0.5 tablet) daily. If BP starts to rise to > 120 for the top number, can go back to previous dose of 50 mg daily.     Multiple Vitamin (MULTIVITAMIN) tablet Take 1 tablet by mouth daily.     rivaroxaban (XARELTO) 20 MG TABS tablet TAKE 1 TABLET BY MOUTH DAILY WITH SUPPER 90 tablet 1   SYMBICORT 80-4.5 MCG/ACT inhaler Inhale 2 puffs into the lungs daily as needed (shortness of breath).     No facility-administered medications prior to visit.   Review of Systems  Review of Systems  Constitutional: Negative.   HENT:  Positive for congestion and postnasal drip.   Psychiatric/Behavioral:  Positive for sleep disturbance.      Physical Exam  BP 122/70   Pulse 68   Ht 5\' 8"  (1.727 m)   Wt 197 lb 3.2 oz (89.4 kg)   SpO2 97%   BMI 29.98 kg/m  Physical Exam Constitutional:      General: She is not in acute distress.    Appearance: Normal appearance. She is not ill-appearing.  HENT:     Head: Normocephalic and atraumatic.  Cardiovascular:     Rate and Rhythm: Normal rate and regular rhythm.     Comments: RRR Pulmonary:     Effort: Pulmonary effort is normal.     Breath sounds: No wheezing.     Comments: No overt wheezing  Musculoskeletal:        General: Normal range of motion.  Skin:    General: Skin is warm and dry.  Neurological:     General: No focal deficit present.     Mental Status: She is alert and oriented to person, place, and time. Mental status is at baseline.  Psychiatric:        Mood and Affect: Mood normal.        Behavior: Behavior normal.        Thought Content: Thought content normal.        Judgment: Judgment normal.      Lab  Results:  CBC    Component Value Date/Time   WBC 6.5 07/04/2023 0925   RBC 5.02 07/04/2023 0925   HGB 14.6 07/04/2023 0925   HGB 14.0 08/19/2020 1309   HCT 43.7 07/04/2023 0925   HCT 41.2 08/19/2020 1309   PLT 250 07/04/2023 0925   PLT 255 08/19/2020 1309   MCV 87.1 07/04/2023 0925   MCV 88 08/19/2020 1309   MCH 29.1 07/04/2023 0925   MCHC 33.4 07/04/2023 0925   RDW 12.2 07/04/2023 0925   RDW 12.1 08/19/2020 1309   LYMPHSABS 2.5 08/19/2020 1309   EOSABS 0.7 (  H) 08/19/2020 1309   BASOSABS 0.1 08/19/2020 1309    BMET    Component Value Date/Time   NA 136 07/04/2023 0925   NA 141 08/19/2020 1309   K 3.8 07/04/2023 0925   CL 107 07/04/2023 0925   CO2 26 07/04/2023 0925   GLUCOSE 123 (H) 07/04/2023 0925   BUN 16 07/04/2023 0925   BUN 23 08/19/2020 1309   CREATININE 0.84 07/04/2023 0925   CALCIUM 9.0 07/04/2023 0925   GFRNONAA >60 07/04/2023 0925   GFRAA >60 04/19/2018 1749    BNP No results found for: "BNP"  ProBNP No results found for: "PROBNP"  Imaging: EP STUDY Result Date: 07/06/2023 See surgical note for result.    Assessment & Plan:   1. OSA (obstructive sleep apnea) (Primary)  2. Longstanding persistent atrial fibrillation (HCC)  3. Mild persistent asthma without complication     Obstructive Sleep Apnea Severe obstructive sleep apnea with 36 apneic events per hour. Intolerant to CPAP due to nasal dryness, phlegm, and mask fit issues. Discussed alternative treatments including Inspire hypoglossal nerve stimulator and oral appliance. Insurance coverage for these options is uncertain and should be verified. - Advised patient contact insurance to inquire about coverage for Inspire and oral appliance. - Recommend patient try a hybrid full face CPAP mask to address nasal dryness and phlegm issues. Advised patient adjust CPAP humidification settings to alleviate nasal dryness. If still having issues tolerating CPAP would proceed to oral appliance or  Inspire. We discussed that oral appliance was better suited for mild-moderate OSA but could be considered for severe OSA if intolerant to CPAP. She would need repeat HST once fitted for oral appliance. She would need sleep endoscopy to complete work up for [employer] candidacy.   Asthma Asthma exacerbated by allergies, using albuterol inhaler daily. Advised to use Symbicort two puffs twice daily to reduce reliance on albuterol. Discussed potential switch to levalbuterol due to atrial fibrillation concerns. - Prescribe Symbicort for use twice daily, two puffs in the morning and two puffs in the evening. - Prescribe levalbuterol (Xopenex) to replace albuterol inhaler due to hx afib. - Renew azelastine nasal spray prescription.  Atrial Fibrillation Atrial fibrillation with recent cardioversion. Regular rate and rhythm on exam. Concerns about albuterol use potentially triggering atrial fibrillation. Discussed switching to levalbuterol to minimize cardiac risks. - Prescribe levalbuterol (Xopenex) to replace albuterol inhaler.      40 mins spent on case; > 50% face to face discussing treatment for OSA and asthma   Glenford Bayley, NP 07/11/2023

## 2023-07-11 NOTE — Patient Instructions (Addendum)
-  OBSTRUCTIVE SLEEP APNEA: Obstructive sleep apnea is a condition where your airway becomes blocked during sleep, causing breathing interruptions. We discussed alternative treatments like the Inspire hypoglossal nerve stimulator and an oral appliance, and you should contact your insurance to check coverage for these options. We also recommend trying a hybrid full face CPAP mask and adjusting the humidification settings to help with nasal dryness and phlegm.  Reach out to Dr. Althea Grimmer to see if they take mediaid for oral appliance - their contact [phone]   -ASTHMA: Asthma is a condition that causes your airways to become inflamed and narrow, making it hard to breathe. Your asthma has been worsened by allergies. We recommend using Symbicort twice daily, two puffs in the morning and two puffs in the evening, to reduce your reliance on albuterol. We also prescribed levalbuterol (Xopenex) to replace your albuterol inhaler and renewed your azelastine nasal spray prescription.  -ATRIAL FIBRILLATION: Atrial fibrillation is an irregular and often rapid heart rate that can increase your risk of strokes and other heart-related complications. We discussed your concerns about albuterol potentially triggering atrial fibrillation and have prescribed levalbuterol (Xopenex) to minimize cardiac risks.  INSTRUCTIONS: Please contact your insurance to inquire about coverage for the Inspire hypoglossal nerve stimulator and oral appliance. Use Symbicort twice daily, two puffs in the morning and two puffs in the evening. Replace your albuterol inhaler with levalbuterol (Xopenex). Try a hybrid full face CPAP mask and adjust the humidification settings to alleviate nasal dryness. Follow up with [place] if you experience any new or worsening symptoms.  Follow-up 4 months with Dr. Vassie Loll

## 2023-07-12 ENCOUNTER — Ambulatory Visit (HOSPITAL_BASED_OUTPATIENT_CLINIC_OR_DEPARTMENT_OTHER): Payer: Medicaid Other | Admitting: Primary Care

## 2023-07-14 ENCOUNTER — Ambulatory Visit (HOSPITAL_COMMUNITY): Admitting: Physician Assistant

## 2023-07-17 ENCOUNTER — Encounter (HOSPITAL_COMMUNITY): Payer: Self-pay | Admitting: Physician Assistant

## 2023-07-17 ENCOUNTER — Ambulatory Visit (HOSPITAL_COMMUNITY)
Admission: RE | Admit: 2023-07-17 | Discharge: 2023-07-17 | Disposition: A | Discharge status: Home / Self Care | Source: Ambulatory Visit | Attending: Physician Assistant | Admitting: Physician Assistant

## 2023-07-17 VITALS — BP 128/78 | HR 59 | Ht 68.0 in | Wt 196.8 lb

## 2023-07-17 DIAGNOSIS — I340 Nonrheumatic mitral (valve) insufficiency: Secondary | ICD-10-CM | POA: Diagnosis not present

## 2023-07-17 DIAGNOSIS — G4733 Obstructive sleep apnea (adult) (pediatric): Secondary | ICD-10-CM | POA: Diagnosis not present

## 2023-07-17 DIAGNOSIS — I10 Essential (primary) hypertension: Secondary | ICD-10-CM | POA: Diagnosis present

## 2023-07-17 DIAGNOSIS — Z5181 Encounter for therapeutic drug level monitoring: Secondary | ICD-10-CM | POA: Diagnosis not present

## 2023-07-17 DIAGNOSIS — Z79899 Other long term (current) drug therapy: Secondary | ICD-10-CM

## 2023-07-17 DIAGNOSIS — Z7901 Long term (current) use of anticoagulants: Secondary | ICD-10-CM | POA: Insufficient documentation

## 2023-07-17 DIAGNOSIS — I4811 Longstanding persistent atrial fibrillation: Secondary | ICD-10-CM | POA: Diagnosis not present

## 2023-07-17 NOTE — Progress Notes (Signed)
 Primary Care Physician: Rebecka Apley, NP Primary Cardiologist: Dr Diona Browner Primary Electrophysiologist: Dr Nelly Laurence  Referring Physician: Dr Alvan Dame Dana Ortiz is a 53 y.o. female with a history of HTN, OSA, and atrial fibrillation who presents for follow up in the [facility]. Patient is on Xarelto for a CHADS2VASC score of 2. She was seen by Dr Diona Browner on 05/12/20 and was believed to be in persistent atrial fibrillation for several months at least, if not longer, per the patient's Apple Watch. She does have symptoms of fatigue and dyspnea with activity while in afib. She has been intolerant of metoprolol and diltiazem in the past due to side effects. She denies significant alcohol use. Patient is s/p DCCV on 07/06/20 with quick return of her afib just under two days later. She does report she felt "great" for those two days which much more energy. Patient is s/p afib ablation 09/12/20.   Patient reported on 06/25/22 after work she had symptoms of palpitations, SOB, dizziness, and chest heaviness. Her smart watch showed afib. This is her first episode since her ablation in 2022. She deferred DCCV until she could get started on CPAP for her severe OSA.  Patient is s/p DCCV on 08/22/22 but quickly returned to afib. She is s/p Afib ablation on 10/05/22 by Dr. Nelly Laurence. She reports that she went back into afib on 07/01/23 with symptoms of fatigue, SOB, and chest discomfort. She was restarted on flecainide and is s/p DCCV on 07/06/23.  Patient returns for follow up for atrial fibrillation and flecainide monitoring. She remains in SR today and feels much improved. She is tolerating flecainide without difficulty. No bleeding issues on anticoagulation.   Today, she  denies symptoms of palpitations, chest pain, shortness of breath, orthopnea, PND, lower extremity edema, dizziness, presyncope, syncope, bleeding, or neurologic sequela. The patient is tolerating medications  without difficulties and is otherwise without complaint today.    Atrial Fibrillation Risk Factors:  she does have symptoms or diagnosis of sleep apnea. she does not have a history of rheumatic fever. she does not have a history of alcohol use. The patient does not have a history of early familial atrial fibrillation or other arrhythmias.   Atrial Fibrillation Management history:  Previous antiarrhythmic drugs: flecainide, amiodarone  Previous cardioversions: 07/06/20, 07/06/23 Previous ablations: 09/12/20, 10/05/22 Anticoagulation history: Xarelto   Past Medical History:  Diagnosis Date   ADHD (attention deficit hyperactivity disorder)    Anxiety    Asthma    Essential hypertension    Paroxysmal atrial fibrillation (HCC)    TMJ arthralgia    Current Outpatient Medications  Medication Sig Dispense Refill   amphetamine-dextroamphetamine (ADDERALL) 20 MG tablet Take 20 mg by mouth 3 (three) times daily.     azelastine (ASTELIN) 0.1 % nasal spray Place 1 spray into both nostrils 2 (two) times daily. Use in each nostril as directed 30 mL 2   bisoprolol (ZEBETA) 5 MG tablet Take 1 tablet (5 mg total) by mouth daily. (Patient taking differently: Take 2.5 mg by mouth daily.) 90 tablet 1   BLACK COHOSH PO Take 3 tablets by mouth 2 (two) times daily.     celecoxib (CELEBREX) 200 MG capsule Take 200 mg by mouth daily.     citalopram (CELEXA) 20 MG tablet Take 20 mg by mouth daily.     EPINEPHrine 0.3 mg/0.3 mL IJ SOAJ injection Inject 0.3 mg into the muscle as needed for anaphylaxis.  estradiol (ESTRACE) 0.1 MG/GM vaginal cream Place 1 Applicatorful vaginally once a week.     flecainide (TAMBOCOR) 100 MG tablet Take 1 tablet (100 mg total) by mouth 2 (two) times daily. 60 tablet 3   fluticasone (FLONASE) 50 MCG/ACT nasal spray Place 1 spray into both nostrils daily as needed for allergies or rhinitis.     HYDROcodone-acetaminophen (NORCO/VICODIN) 5-325 MG tablet Take 1 tablet by mouth  daily.     levalbuterol (XOPENEX HFA) 45 MCG/ACT inhaler Inhale 2 puffs into the lungs every 6 (six) hours as needed for wheezing or shortness of breath.     losartan (COZAAR) 50 MG tablet Take 25 mg ( 0.5 tablet) daily. If BP starts to rise to > 120 for the top number, can go back to previous dose of 50 mg daily.     Multiple Vitamin (MULTIVITAMIN) tablet Take 1 tablet by mouth daily.     PREMARIN vaginal cream Place 1 applicator vaginally daily.     rivaroxaban (XARELTO) 20 MG TABS tablet TAKE 1 TABLET BY MOUTH DAILY WITH SUPPER 90 tablet 1   SYMBICORT 80-4.5 MCG/ACT inhaler Inhale 2 puffs into the lungs daily as needed (shortness of breath).     No current facility-administered medications for this encounter.     ROS- All systems are reviewed and negative except as per the HPI above.  Physical Exam: Vitals:   07/17/23 1524  BP: 128/78  Pulse: (!) 59  Weight: 89.3 kg  Height: 5\' 8"  (1.727 m)     GEN: Well nourished, well developed in no acute distress CARDIAC: Regular rate and rhythm, no murmurs, rubs, gallops RESPIRATORY:  Clear to auscultation without rales, wheezing or rhonchi  ABDOMEN: Soft, non-tender, non-distended EXTREMITIES:  No edema; No deformity    Wt Readings from Last 3 Encounters:  07/17/23 89.3 kg  07/11/23 89.4 kg  07/04/23 90 kg    EKG today demonstrates  SB, slow R wave prog Vent. rate 59 BPM PR interval 166 ms QRS duration 98 ms QT/QTcB 442/437 ms   Echo 07/01/20 demonstrated  1. Left ventricular ejection fraction, by estimation, is approximately  50%. The left ventricle has low normal function. The left ventricle  demonstrates global hypokinesis. There is mild left ventricular  hypertrophy. Left ventricular diastolic parameters are indeterminate.   2. Right ventricular systolic function is normal. The right ventricular  size is normal. There is normal pulmonary artery systolic pressure. The  estimated right ventricular systolic pressure is  22.0 mmHg.   3. Left atrial size was moderately to severely dilated.   4. Right atrial size was upper normal.   5. There is a trivial pericardial effusion posterior to the left  ventricle.   6. The mitral valve is grossly normal. Mild to moderate mitral valve  regurgitation made up of two jets.   7. The aortic valve is tricuspid. Aortic valve regurgitation is not  visualized.   8. The inferior vena cava is normal in size with greater than 50%  respiratory variability, suggesting right atrial pressure of 3 mmHg.   9. Cannot exclude small left to right interatrial shunt, possible PFO.   Comparison(s): Echocardiogram done 09/29/16 showed an EF of 55-60%.   Epic records are reviewed at length today  CHA2DS2-VASc Score = 2  The patient's score is based upon: CHF History: 0 HTN History: 1 Diabetes History: 0 Stroke History: 0 Vascular Disease History: 0 Age Score: 0 Gender Score: 1       ASSESSMENT AND PLAN: Longstanding  Persistent Atrial Fibrillation (ICD10:  I48.11) The patient's CHA2DS2-VASc score is 2, indicating a 2.2% annual risk of stroke.   S/p afib ablation 09/12/20 and 10/05/22 S/p DCCV 07/06/23 Patient appears to be maintaining SR.  Continue flecainide 100 mg BID Continue Xarelto 20 mg daily Continue bisoprolol 5 mg daily Apple Watch for home monitoring.   High Risk Medication Monitoring (ICD 10: Z79.899) Intervals on ECG acceptable for flecainide monitoring.       OSA  Severe on HST Followed by Pulmonary at [facility]  Patient considering oral appliance and Inspire.  HTN Stable on current regimen  VHD Mild to moderate MR Followed by Dr Diona Browner    Follow up with Dr Nelly Laurence as scheduled.     Jorja Loa PA-C [facility] [address] [place], [place] [address] [phone] 07/17/2023 3:49 PM

## 2023-07-19 ENCOUNTER — Other Ambulatory Visit: Payer: Self-pay

## 2023-07-19 ENCOUNTER — Ambulatory Visit: Payer: Self-pay | Admitting: Adult Health Nurse Practitioner

## 2023-07-19 MED ORDER — LEVALBUTEROL TARTRATE 45 MCG/ACT IN AERO: 2.0000 | INHALATION_SPRAY | Freq: Four times a day (QID) | RESPIRATORY_TRACT | 11 refills | Status: AC | PRN

## 2023-07-19 NOTE — Telephone Encounter (Signed)
 Copied from CRM [id number]. Topic: Clinical - Prescription Issue >> Jul 19, 2023  4:24 PM Nila Nephew wrote: Reason for CRM: Sam with [facility] calling to state that they need the rx for levalbuterol [facility] HFA) 45 MCG/ACT inhaler resent, as they did not get it. Written date shows 07/11/23. Fax Number: [phone]

## 2023-07-19 NOTE — Telephone Encounter (Signed)
 I have resent in the medication.  Nothing further needed.

## 2023-07-25 ENCOUNTER — Telehealth: Payer: Self-pay

## 2023-07-25 NOTE — Telephone Encounter (Signed)
 Copied from CRM [id number]. Topic: Clinical - Prescription Issue >> Jul 24, 2023 11:37 AM Theodis Sato wrote: Reason for CRM:  Please call patient back to discuss submitting a prior authorization to her medicaid for the inspire device. Patient states Rolene Arbour will need the provider to provide details on the patient trying options A,B, and C before [employer] would cover the inspire .Provider line for [facility]: [phone]  Patient is also wanting to speak with a nurse regarding her current CPAP machine and new mask as she is having trouble affording different pieces and would like to discuss next steps as as [employer] is trying to charge her $900 for her current CPAP machine and patient would like to discuss a work around.  ATC x1 LDVMTCB Routing to [place]. Please advise about Inspire information.

## 2023-08-03 ENCOUNTER — Ambulatory Visit (HOSPITAL_BASED_OUTPATIENT_CLINIC_OR_DEPARTMENT_OTHER): Admitting: Adult Health

## 2023-08-03 NOTE — Telephone Encounter (Signed)
 LM Xs 3 - closing message per protocol.

## 2023-08-16 ENCOUNTER — Encounter: Payer: Self-pay | Admitting: Cardiovascular Disease

## 2023-08-16 ENCOUNTER — Ambulatory Visit: Attending: Cardiovascular Disease | Admitting: Cardiovascular Disease

## 2023-08-16 ENCOUNTER — Encounter: Payer: Self-pay | Admitting: Physician Assistant

## 2023-08-16 VITALS — BP 112/60 | HR 56 | Ht 68.0 in | Wt 196.0 lb

## 2023-08-16 DIAGNOSIS — I4819 Other persistent atrial fibrillation: Secondary | ICD-10-CM | POA: Insufficient documentation

## 2023-08-16 NOTE — Progress Notes (Signed)
   PCP: Dana Hymen, NP Primary Cardiologist: Dr Dana Ortiz Primary EP: Dr Dana Ortiz  Dana Ortiz is a 53 y.o. female who presents today for routine electrophysiology followup.         S he has a history of longstanding persistent atrial fibrillation and underwent ablation by Dr. Nunzio Ortiz in May 2022.  He had recurrence of atrial fibrillation and underwent DC cardioversion in May 2024 that was accessible.  She underwent ablation for atrial fibrillation on October 05, 2022.  She had a small area of connection on the right superior pulmonary vein and significant breakthrough along the inferior posterior wall line that had been previously performed.  I then reinforced the prior pulmonary vein encirclement and posterior wall lesion sets.  She did have transient hypotension after the procedure, likely due to dehydration and anesthesia.    Unfortunately, she had recurrence of atrial fibrillation and underwent repeat DC cardioversion in March 2025  She has not been well tolerating her CPAP mask and has an appoint with her pulmonologist to have it adjusted.        Today, she denies symptoms of palpitations, chest pain, shortness of breath,  lower extremity edema, dizziness, presyncope, or syncope.  The patient is otherwise without complaint today.    Physical Exam: Vitals:   08/16/23 1424  BP: 112/60  Pulse: (!) 56  SpO2: 97%  Weight: 196 lb (88.9 kg)  Height: 5\' 8"  (1.727 m)     Gen: Appears comfortable, well-nourished CV: RRR, no dependent edema Pulm: breathing easily   Wt Readings from Last 3 Encounters:  08/16/23 196 lb (88.9 kg)  07/17/23 196 lb 12.8 oz (89.3 kg)  07/11/23 197 lb 3.2 oz (89.4 kg)      Assessment and Plan:  Persistent afib Well controlled post ablation Previously failed flecainide , had ablation, then recurrence of atrial fibrillation  Doing well back on flecainide  I do not think that she has much additional target for A-fib ablation Continue  flecainide  EKG and labs July 04, 2023 reviewed   Secondary hypercoagulable state On xarelto  15 mg for chads2vasc score of 2   HTN Stable No change required today  Obstructive sleep apnea We discussed the importance of CPAP use for the maintenance of durable sinus rhythm  Follow-up with me or Dr Dana Ortiz in 6 months  Dana Grange, MD 08/16/2023 2:30 PM

## 2023-08-16 NOTE — Patient Instructions (Signed)

## 2023-09-24 NOTE — Progress Notes (Unsigned)
 Dana Canard, PA-C [address] [place], [place]  [address] Phone: [phone]   Gastroenterology Consultation  Referring Provider:     Fonda Hymen, NP Primary Care Physician:  Fonda Hymen, NP Primary Gastroenterologist:  Dana Canard, PA-C / Darol Elizabeth, MD  Reason for Consultation:     Discuss Colonoscopy; Is on Xarelto         HPI:   Dana Ortiz is a 53 y.o. y/o female referred for consultation & management  by Fonda Hymen, NP.  New patient.  She is here to schedule her first screening colonoscopy.  Patient denies any GI symptoms.  No previous Colonoscopy or GI evaluation.  She has family history of her mother who had colon polyps and 2 maternal uncles who had colon cancer.  PMH: Atrial fibrillation, ADHD, sleep apnea, asthma, hypertension, anxiety.  S/p cardiac ablation 08/2020, and 09/2022.  S/p cardioversion 07/06/2023.  Currently on Xarelto  and flecainide .  Afib is under good control.  Also takes [place] for weight management.  Cardiologist Dr. Arlester Ladd.  Past Medical History:  Diagnosis Date   ADHD (attention deficit hyperactivity disorder)    Anxiety    Arthritis    Asthma    Essential hypertension    Paroxysmal atrial fibrillation (HCC)    Sleep apnea    TMJ arthralgia     Past Surgical History:  Procedure Laterality Date   ATRIAL FIBRILLATION ABLATION N/A 09/11/2020   Procedure: ATRIAL FIBRILLATION ABLATION;  Surgeon: Jolly Needle, MD;  Location: [facility];  Service: Cardiovascular;  Laterality: N/A;   ATRIAL FIBRILLATION ABLATION N/A 10/05/2022   Procedure: ATRIAL FIBRILLATION ABLATION;  Surgeon: Efraim Grange, MD;  Location: [facility];  Service: Cardiovascular;  Laterality: N/A;   CARDIOVERSION N/A 07/06/2020   Procedure: CARDIOVERSION;  Surgeon: Darlis Eisenmenger, MD;  Location: [facility] ENDOSCOPY;  Service: Cardiovascular;  Laterality: N/A;   CARDIOVERSION N/A 08/22/2022   Procedure: CARDIOVERSION;   Surgeon: Jann Melody, MD;  Location: [facility];  Service: Cardiovascular;  Laterality: N/A;   CARDIOVERSION N/A 07/06/2023   Procedure: CARDIOVERSION;  Surgeon: Lenise Quince, MD;  Location: [facility];  Service: Cardiovascular;  Laterality: N/A;   TEE WITHOUT CARDIOVERSION N/A 09/10/2020   Procedure: TRANSESOPHAGEAL ECHOCARDIOGRAM (TEE);  Surgeon: Lenise Quince, MD;  Location: [facility] ENDOSCOPY;  Service: Cardiovascular;  Laterality: N/A;   TONSILLECTOMY     TUBAL LIGATION      Prior to Admission medications   Medication Sig Start Date End Date Taking? Authorizing Provider  amphetamine -dextroamphetamine  (ADDERALL) 20 MG tablet Take 20 mg by mouth 3 (three) times daily.    [provider]  azelastine  (ASTELIN ) 0.1 % nasal spray Place 1 spray into both nostrils 2 (two) times daily. Use in each nostril as directed 07/11/23   Antonio Baumgarten, NP  bisoprolol  (ZEBETA ) 5 MG tablet Take 1 tablet (5 mg total) by mouth daily. Patient taking differently: Take 2.5 mg by mouth daily. 03/21/23   Gerard Knight, MD  BLACK COHOSH PO Take 3 tablets by mouth 2 (two) times daily.    [provider]  celecoxib  (CELEBREX ) 200 MG capsule Take 200 mg by mouth daily. 01/28/20   [provider]  citalopram  (CELEXA ) 20 MG tablet Take 20 mg by mouth daily. 02/05/20   [provider]  EPINEPHrine  0.3 mg/0.3 mL IJ SOAJ injection Inject 0.3 mg into the muscle as needed for anaphylaxis. 02/20/23  [provider]  flecainide  (TAMBOCOR ) 100 MG tablet Take 1 tablet (100 mg total) by mouth 2 (two) times daily. 07/04/23   Fenton, Clint R, PA  levalbuterol  (XOPENEX  HFA) 45 MCG/ACT inhaler Inhale 2 puffs into the lungs every 6 (six) hours as needed for wheezing or shortness of breath. 07/19/23 07/18/24  Antonio Baumgarten, NP  losartan  (COZAAR ) 50 MG tablet Take 25 mg ( 0.5 tablet) daily. If BP starts to rise to > 120 for the top number, can go back to  previous dose of 50 mg daily. 10/06/22   Tylene Galla, PA-C  Multiple Vitamin (MULTIVITAMIN) tablet Take 1 tablet by mouth daily.    [provider]  PREMARIN vaginal cream Place 1 applicator vaginally daily. 07/12/23   [provider]  rivaroxaban  (XARELTO ) 20 MG TABS tablet TAKE 1 TABLET BY MOUTH DAILY WITH SUPPER 06/28/23   Mealor, Augustus E, MD  SYMBICORT 80-4.5 MCG/ACT inhaler Inhale 2 puffs into the lungs daily as needed (shortness of breath). 02/20/23   [provider]  WEGOVY 0.25 MG/0.5ML SOAJ Inject 0.25 mg into the skin once a week. 07/27/23   [provider]    Family History  Problem Relation Age of Onset   Diabetes Mother    Glaucoma Mother    Cancer - Ovarian Mother    Colon polyps Mother    Atrial fibrillation Mother    Kidney cancer Father    Ulcerative colitis Brother    Anemia Maternal Grandmother    Cancer Maternal Grandmother        blood   Colon cancer Maternal Uncle        x 2 uncles     Social History   Tobacco Use   Smoking status: Never   Smokeless tobacco: Never   Tobacco comments:    Never smoke 06/27/22  Vaping Use   Vaping status: Never Used  Substance Use Topics   Alcohol use: Yes    Alcohol/week: 7.0 standard drinks of alcohol    Types: 7 Glasses of wine per week    Comment: one glass of wine weekly 08/15/22   Drug use: No    Allergies as of 09/25/2023 - Review Complete 09/25/2023  Allergen Reaction Noted   Morphine Anaphylaxis 09/25/2023    Review of Systems:    All systems reviewed and negative except where noted in HPI.   Physical Exam:  BP 100/60 (BP Location: Left Arm, Patient Position: Sitting, Cuff Size: Normal)   Pulse 60   Ht 5\' 6"  (1.676 m) Comment: height measured without shoes  Wt 188 lb 8 oz (85.5 kg)   BMI 30.42 kg/m  No LMP recorded. Patient is postmenopausal.  General:   Alert,  Well-developed, well-nourished, pleasant and cooperative in NAD Lungs:  Respirations even  and unlabored.  Clear throughout to auscultation.   No wheezes, crackles, or rhonchi. No acute distress. Heart:  Regular rate and rhythm; no murmurs, clicks, rubs, or gallops. Abdomen:  Normal bowel sounds.  No bruits.  Soft, and non-distended without masses, hepatosplenomegaly or hernias noted.  No Tenderness.  No guarding or rebound tenderness.    Neurologic:  Alert and oriented x3;  grossly normal neurologically. Psych:  Alert and cooperative. Normal mood and affect.  Imaging Studies: No results found.  Labs: CBC    Component Value Date/Time   WBC 6.5 07/04/2023 0925   RBC 5.02 07/04/2023 0925   HGB 14.6 07/04/2023 0925   HGB 14.0 08/19/2020 1309   HCT 43.7  07/04/2023 0925   HCT 41.2 08/19/2020 1309   PLT 250 07/04/2023 0925   PLT 255 08/19/2020 1309   MCV 87.1 07/04/2023 0925   MCV 88 08/19/2020 1309   MCH 29.1 07/04/2023 0925   MCHC 33.4 07/04/2023 0925   RDW 12.2 07/04/2023 0925   RDW 12.1 08/19/2020 1309   LYMPHSABS 2.5 08/19/2020 1309   EOSABS 0.7 (H) 08/19/2020 1309   BASOSABS 0.1 08/19/2020 1309    CMP     Component Value Date/Time   NA 136 07/04/2023 0925   NA 141 08/19/2020 1309   K 3.8 07/04/2023 0925   CL 107 07/04/2023 0925   CO2 26 07/04/2023 0925   GLUCOSE 123 (H) 07/04/2023 0925   BUN 16 07/04/2023 0925   BUN 23 08/19/2020 1309   CREATININE 0.84 07/04/2023 0925   CALCIUM 9.0 07/04/2023 0925   PROT 6.3 (L) 08/31/2022 0956   ALBUMIN 3.8 08/31/2022 0956   AST 26 08/31/2022 0956   ALT 37 08/31/2022 0956   ALKPHOS 63 08/31/2022 0956   BILITOT 0.4 08/31/2022 0956   GFRNONAA >60 07/04/2023 0925   GFRAA >60 04/19/2018 1749    Assessment and Plan:   Corina Toya Palacios is a 53 y.o. y/o female has been referred for:  1.  Family history of colon cancer (maternal uncles) 2.  Family history of colon polyps (mother) - Scheduling Colonoscopy I discussed risks of colonoscopy with patient to include risk of bleeding, colon perforation, and risk of sedation.   Patient expressed understanding and agrees to proceed with colonoscopy.  - She will likely need colonoscopy every 5 years given her family history.  3.  Atrial fibrillation s/p ablation 08/2020 & 09/2022 and cardioversion 06/2023; currently on Xarelto ; cardiologist Dr. Arlester Ladd; Currently doing well. -We are requesting permission to hold Xarelto  2 days prior to colonoscopy from Cardiologist DR. Mealor.  Follow up as needed.  Dana Canard, PA-C

## 2023-09-25 ENCOUNTER — Encounter: Payer: Self-pay | Admitting: Physician Assistant

## 2023-09-25 ENCOUNTER — Ambulatory Visit (INDEPENDENT_AMBULATORY_CARE_PROVIDER_SITE_OTHER): Admitting: Physician Assistant

## 2023-09-25 VITALS — BP 100/60 | HR 60 | Ht 66.0 in | Wt 188.5 lb

## 2023-09-25 DIAGNOSIS — I4891 Unspecified atrial fibrillation: Secondary | ICD-10-CM

## 2023-09-25 DIAGNOSIS — Z83719 Family history of colon polyps, unspecified: Secondary | ICD-10-CM

## 2023-09-25 DIAGNOSIS — Z800 Family history of malignant neoplasm of digestive organs: Secondary | ICD-10-CM | POA: Diagnosis not present

## 2023-09-25 DIAGNOSIS — Z7901 Long term (current) use of anticoagulants: Secondary | ICD-10-CM | POA: Diagnosis not present

## 2023-09-25 MED ORDER — NA SULFATE-K SULFATE-MG SULF 17.5-3.13-1.6 GM/177ML PO SOLN: 1.0000 | Freq: Once | ORAL | 0 refills | Status: AC

## 2023-09-25 NOTE — Patient Instructions (Signed)
 You have been scheduled for a colonoscopy. Please follow written instructions given to you at your visit today.   If you use inhalers (even only as needed), please bring them with you on the day of your procedure.  DO NOT TAKE 7 DAYS PRIOR TO TEST- Trulicity (dulaglutide) Ozempic, Wegovy (semaglutide) Mounjaro (tirzepatide) Bydureon Bcise (exanatide extended release)  DO NOT TAKE 1 DAY PRIOR TO YOUR TEST Rybelsus (semaglutide) Adlyxin (lixisenatide) Victoza (liraglutide) Byetta (exanatide) _____________________________________________________________________  _______________________________________________________  If your blood pressure at your visit was 140/90 or greater, please contact your primary care physician to follow up on this.  _______________________________________________________  If you are age 75 or older, your body mass index should be between 23-30. Your Body mass index is 30.42 kg/m. If this is out of the aforementioned range listed, please consider follow up with your Primary Care Provider.  If you are age 52 or younger, your body mass index should be between 19-25. Your Body mass index is 30.42 kg/m. If this is out of the aformentioned range listed, please consider follow up with your Primary Care Provider.   ________________________________________________________  The [facility] providers would like to encourage you to use MYCHART to communicate with providers for non-urgent requests or questions.  Due to long hold times on the telephone, sending your provider a message by [facility] may be a faster and more efficient way to get a response.  Please allow 48 business hours for a response.  Please remember that this is for non-urgent requests.  _______________________________________________________

## 2023-10-09 NOTE — Progress Notes (Signed)
 Agree with the assessment and plan as outlined by Brigitte Canard, PA-C.

## 2023-10-12 ENCOUNTER — Encounter: Payer: Self-pay | Admitting: Cardiology

## 2023-10-13 ENCOUNTER — Other Ambulatory Visit: Payer: Self-pay

## 2023-10-13 MED ORDER — LOSARTAN POTASSIUM 50 MG PO TABS: ORAL_TABLET | ORAL | 3 refills | Status: AC

## 2023-10-23 ENCOUNTER — Encounter: Payer: Self-pay | Admitting: Cardiovascular Disease

## 2023-10-27 ENCOUNTER — Telehealth: Payer: Self-pay | Admitting: *Deleted

## 2023-10-27 ENCOUNTER — Other Ambulatory Visit (HOSPITAL_COMMUNITY): Payer: Self-pay | Admitting: Physician Assistant

## 2023-10-27 NOTE — Telephone Encounter (Signed)
 [facility] Pre-operative Risk Assessment     Request for surgical clearance:     Endoscopy Procedure  What type of surgery is being performed?     colonoscopy  When is this surgery scheduled?     11/02/23  What type of clearance is required ?   Pharmacy  Are there any medications that need to be held prior to surgery and how long? Xarelto  2 days  Practice name and name of physician performing surgery?      [facility]  What is your office phone and fax number?      Phone- [phone]  Fax- [phone]  Anesthesia type (None, local, MAC, general) ?       MAC   Please route your response to Powell Misty, CMA

## 2023-10-27 NOTE — Telephone Encounter (Signed)
Clinical pharmacist to review Xarelto 

## 2023-10-30 NOTE — Telephone Encounter (Signed)
   Patient Name: Dana Ortiz  DOB: 1971/03/27 MRN: [id number]  Primary Cardiologist: Jayson Sierras, MD  Clinical pharmacists have reviewed the patient's past medical history, labs, and current medications as part of preoperative protocol coverage. The following recommendations have been made:  Patient with diagnosis of A Fib on Xarelto  for anticoagulation.     Procedure: colonoscopy Date of procedure: 11/02/23   CHA2DS2-VASc Score = 2  This indicates a 2.2% annual risk of stroke. The patient's score is based upon: CHF History: 0 HTN History: 1 Diabetes History: 0 Stroke History: 0 Vascular Disease History: 0 Age Score: 0 Gender Score: 1   CrCl 103 ml/min Platelet count 250K   Per office protocol, patient can hold Xarelto  for 2 days prior to procedure. Please resume when safe to do so from a bleeding standpoint.    I will route this recommendation to the requesting party via Epic fax function and remove from pre-op pool.  Please call with questions.  Marvelous Bouwens D Alainah Phang, NP 10/30/2023, 7:52 AM

## 2023-10-30 NOTE — Telephone Encounter (Signed)
 Left message for patient to call office.

## 2023-10-30 NOTE — Telephone Encounter (Signed)
 Patient with diagnosis of A Fib on Xarelto  for anticoagulation.    Procedure: colonoscopy Date of procedure: 11/02/23   CHA2DS2-VASc Score = 2  This indicates a 2.2% annual risk of stroke. The patient's score is based upon: CHF History: 0 HTN History: 1 Diabetes History: 0 Stroke History: 0 Vascular Disease History: 0 Age Score: 0 Gender Score: 1       CrCl 103 ml/min Platelet count 250K   Per office protocol, patient can hold Xarelto  for 2 days prior to procedure.    **This guidance is not considered finalized until pre-operative APP has relayed final recommendations.**

## 2023-11-01 NOTE — Telephone Encounter (Signed)
 Patient informed.

## 2023-11-06 ENCOUNTER — Ambulatory Visit (HOSPITAL_BASED_OUTPATIENT_CLINIC_OR_DEPARTMENT_OTHER): Admitting: Pulmonary Disease

## 2023-11-06 ENCOUNTER — Encounter (HOSPITAL_BASED_OUTPATIENT_CLINIC_OR_DEPARTMENT_OTHER): Payer: Self-pay

## 2023-11-06 ENCOUNTER — Ambulatory Visit (HOSPITAL_BASED_OUTPATIENT_CLINIC_OR_DEPARTMENT_OTHER)

## 2023-11-06 ENCOUNTER — Telehealth: Payer: Self-pay

## 2023-11-06 VITALS — BP 117/72 | HR 64 | Ht 66.0 in | Wt 180.5 lb

## 2023-11-06 DIAGNOSIS — J4520 Mild intermittent asthma, uncomplicated: Secondary | ICD-10-CM | POA: Diagnosis not present

## 2023-11-06 DIAGNOSIS — G4733 Obstructive sleep apnea (adult) (pediatric): Secondary | ICD-10-CM

## 2023-11-06 NOTE — Progress Notes (Signed)
 @Patient  ID: Dana Ortiz, female    DOB: July 19, 1970, 53 y.o.   MRN: [id number]  Chief Complaint  Patient presents with   Follow-up    OSA    Referring provider: Baird Comer GAILS, NP  HPI: Dana Ortiz is a 53 y/o female never smoker with PMH of asthma and OSA on CPAP who presents today for concerns with her CPAP machine.  She was last seen on 07/11/2023 in our clinic and is a patient of Dr. Cyndi.  Her sleep test was completed in January 2024 and revealed severe sleep apnea with an AHI of 36/hr and O2 sat nadir of 70%.  Since then she has attempted CPAP with a fixed pressure, and subsequently tried AutoPAP as well.  She has also attempted different masks and changing humidification and sensitivity settings, but ultimately has been unable to tolerate the machine.  She reports that despite adjustments, she has had nasal sores that have made it intolerable to wear.  She also complained of excess phlegm associated with its use that have made it difficult to wear.  Today we discussed the risks of untreated sleep apnea, especially with her degree of severity, along with other options for management.  We discussed transitioning to an oral appliance, but that overall this would not be the best intervention since it is meant for more mild cases of sleep apnea.  She is interested in the Jackson device; we reviewed some specifics of the procedure itself, qualification requirements, and next steps.  She denies fever, chills, chest pain, productive cough, HA, or other c/o presently.  She does c/o ongoing daytime fatigue and snoring.     TEST/EVENTS : recently seen by urgent care for asthma; completed one injection of triamcinolone and a course of doxycycline and reports feeling better  Allergies  Allergen Reactions   Morphine Anaphylaxis    Can tolerate orally- did not tolerate when it was introduced via IV     Immunization History  Administered Date(s) Administered   Influenza-Unspecified  02/20/2014, 03/31/2015, 12/18/2019, 02/10/2020, 01/30/2022   PNEUMOCOCCAL CONJUGATE-20 03/30/2022   Pneumococcal Polysaccharide-23 11/02/2012   Tdap 11/03/2010   Zoster Recombinant(Shingrix) 12/18/2019, 05/26/2020    Past Medical History:  Diagnosis Date   ADHD (attention deficit hyperactivity disorder)    Anxiety    Arthritis    Asthma    Essential hypertension    Paroxysmal atrial fibrillation (HCC)    Sleep apnea    TMJ arthralgia     Tobacco History: Social History   Tobacco Use  Smoking Status Never  Smokeless Tobacco Never  Tobacco Comments   Never smoke 06/27/22   Counseling given: Not Answered Tobacco comments: Never smoke 06/27/22   Outpatient Medications Prior to Visit  Medication Sig Dispense Refill   amphetamine -dextroamphetamine  (ADDERALL) 20 MG tablet Take 20 mg by mouth 3 (three) times daily.     azelastine  (ASTELIN ) 0.1 % nasal spray Place 1 spray into both nostrils 2 (two) times daily. Use in each nostril as directed 30 mL 2   bisoprolol  (ZEBETA ) 5 MG tablet Take 2.5 mg by mouth daily.     celecoxib  (CELEBREX ) 200 MG capsule Take 200 mg by mouth daily.     citalopram  (CELEXA ) 20 MG tablet Take 20 mg by mouth daily.     clobetasol cream (TEMOVATE) 0.05 % Apply 1 Application topically as needed.     flecainide  (TAMBOCOR ) 100 MG tablet TAKE 1 TABLET BY MOUTH TWICE DAILY 60 tablet 3   levalbuterol  (XOPENEX  HFA) 45  MCG/ACT inhaler Inhale 2 puffs into the lungs every 6 (six) hours as needed for wheezing or shortness of breath. 1 each 11   losartan  (COZAAR ) 50 MG tablet Take 25 mg ( 0.5 tablet) daily. If BP starts to rise to > 120 for the top number, can go back to previous dose of 50 mg daily. 45 tablet 3   Multiple Vitamin (MULTIVITAMIN) tablet Take 1 tablet by mouth daily.     ondansetron  (ZOFRAN -ODT) 8 MG disintegrating tablet Take 8 mg by mouth every 8 (eight) hours as needed.     PREMARIN vaginal cream Place 1 applicator vaginally daily.     rivaroxaban   (XARELTO ) 20 MG TABS tablet TAKE 1 TABLET BY MOUTH DAILY WITH SUPPER 90 tablet 1   SYMBICORT 80-4.5 MCG/ACT inhaler Inhale 2 puffs into the lungs daily as needed (shortness of breath).     WEGOVY 0.25 MG/0.5ML SOAJ Inject 1 mg into the skin once a week.     No facility-administered medications prior to visit.     Review of Systems:   Constitutional:   No  weight loss, night sweats,  Fevers, chills, or  lassitude.  Positive for fatigue, snoring, daytime sleepiness  HEENT:   No headaches,  Difficulty swallowing,  Tooth/dental problems, or  Sore throat,                No sneezing, itching, ear ache, nasal congestion, post nasal drip,   CV:  No chest pain,  Orthopnea, PND, swelling in lower extremities, anasarca, dizziness, palpitations, syncope.   GI  No heartburn, indigestion, abdominal pain, nausea, vomiting, diarrhea, change in bowel habits, loss of appetite, bloody stools.   Resp: No shortness of breath with exertion or at rest.  No excess mucus, no productive cough,  No non-productive cough,  No coughing up of blood.  No change in color of mucus.  No wheezing.  No chest wall deformity  Skin: no rash or lesions.  GU: no dysuria, change in color of urine, no urgency or frequency.  No flank pain, no hematuria   MS:  No joint pain or swelling.  No decreased range of motion.  No back pain.    Physical Exam  BP 117/72   Pulse 64   Ht 5' 6 (1.676 m)   Wt 180 lb 8 oz (81.9 kg)   SpO2 96%   BMI 29.13 kg/m   GEN: A/Ox3; pleasant , NAD, well nourished    HEENT:  [place]/AT,  EACs-clear, TMs-wnl, NOSE-clear, THROAT-clear, no lesions, no postnasal drip or exudate noted.   NECK:  Supple w/ fair ROM; no JVD; normal carotid impulses w/o bruits; no thyromegaly or nodules palpated; no lymphadenopathy.    RESP  Clear  P & A; w/o, wheezes/ rales/ or rhonchi. no accessory muscle use, no dullness to percussion  CARD:  RRR, no m/r/g, no peripheral edema, pulses intact, no cyanosis or  clubbing.  GI:   Soft & nt; nml bowel sounds; no organomegaly or masses detected.   Musco: Warm bil, no deformities or joint swelling noted.   Neuro: alert, no focal deficits noted.    Skin: Warm, no lesions or rashes    Lab Results:  CBC    Component Value Date/Time   WBC 6.5 07/04/2023 0925   RBC 5.02 07/04/2023 0925   HGB 14.6 07/04/2023 0925   HGB 14.0 08/19/2020 1309   HCT 43.7 07/04/2023 0925   HCT 41.2 08/19/2020 1309   PLT 250 07/04/2023 0925   PLT  255 08/19/2020 1309   MCV 87.1 07/04/2023 0925   MCV 88 08/19/2020 1309   MCH 29.1 07/04/2023 0925   MCHC 33.4 07/04/2023 0925   RDW 12.2 07/04/2023 0925   RDW 12.1 08/19/2020 1309   LYMPHSABS 2.5 08/19/2020 1309   EOSABS 0.7 (H) 08/19/2020 1309   BASOSABS 0.1 08/19/2020 1309    BMET    Component Value Date/Time   NA 136 07/04/2023 0925   NA 141 08/19/2020 1309   K 3.8 07/04/2023 0925   CL 107 07/04/2023 0925   CO2 26 07/04/2023 0925   GLUCOSE 123 (H) 07/04/2023 0925   BUN 16 07/04/2023 0925   BUN 23 08/19/2020 1309   CREATININE 0.84 07/04/2023 0925   CALCIUM 9.0 07/04/2023 0925   GFRNONAA >60 07/04/2023 0925   GFRAA >60 04/19/2018 1749    BNP No results found for: BNP  ProBNP No results found for: PROBNP  Imaging: No results found.  Administration History     None           No data to display          No results found for: NITRICOXIDE   Assessment & Plan:   Assessment & Plan OSA (obstructive sleep apnea) -  Through shared decision making, the patient decided to proceed with next steps for the Inspire device -  Case d/w Dr. Jude:  plan for sleep endoscopy and referral to ENT for Inspire placement pending those results -  Follow up pending evaluations Mild intermittent asthma without complication -  No changes in current management   Return in about 10 weeks (around 01/15/2024) for Inspire.  Candis Dandy, PA-C 11/06/2023

## 2023-11-06 NOTE — Telephone Encounter (Signed)
-----   Message ----- From: Dyan Rush, CRNA Sent: 11/06/2023   9:50 AM EDT To: Naomie LOISE Sharps, RN; Glendia FORBES Holt, MD Subject: RE: Antibiotic                                 Dr. Holt,  Due to her recent asthmatic exacerbation greatly increasing her airway reactivity, her EGD should be delay until six weeks after she has completed her antibx.  Regards,  John M.Nulty ----- Message ----- From: Sharps Naomie LOISE, RN Sent: 11/06/2023   9:32 AM EDT To: Rush Dyan, CRNA; Glendia FORBES Holt, MD Subject: FW: Antibiotic                                 Please advise-  I am unable to locate chest xray imaging results that necessitated doxycycline Rx. Are you comfortable with patient keeping upcoming 11/08/23 endoscopy or should we reschedule her for a few weeks out?

## 2023-11-06 NOTE — Patient Instructions (Signed)
 Will need sleep endoscopy prior to proceeding with next steps for Inspire device placement; informational materials provided at today's appointment.  You will receive a call for scheduling of the sleep endoscopy; it will likely occur in August 2025.    ENT referral for device placement will be completed pending results of sleep endoscopy.

## 2023-11-06 NOTE — Assessment & Plan Note (Signed)
-    Through shared decision making, the patient decided to proceed with next steps for the Inspire device -  Case d/w Dr. Jude:  plan for sleep endoscopy and referral to ENT for Inspire placement pending those results -  Follow up pending evaluations

## 2023-11-06 NOTE — Telephone Encounter (Signed)
 Patient meets requirements for consideration of Inspire device.  Please schedule for sleep endoscopy.  D/w Dr. Jude.

## 2023-11-06 NOTE — Assessment & Plan Note (Signed)
No changes in current management.

## 2023-11-06 NOTE — Telephone Encounter (Signed)
 Correction: procedure is colonoscopy, not endoscopy.  I have spoken to patient to advise that we will need to postpone her procedure at least 6 weeks to allow time for healing. Patient has rescheduled her procedure to 12/20/23 at 830 am. She verbalizes understanding.

## 2023-11-08 ENCOUNTER — Encounter: Payer: Self-pay | Admitting: Pulmonary Disease

## 2023-11-08 ENCOUNTER — Encounter: Admitting: Gastroenterology

## 2023-11-08 NOTE — Addendum Note (Signed)
 Addended by: JUDE HARDEN GAILS on: 11/08/2023 03:12 PM   Modules accepted: Orders

## 2023-11-08 NOTE — Telephone Encounter (Addendum)
 I scheduled the following:  Please let pt know  Provider performing procedure:Virdia Ziesmer Diagnosis: OSA Procedure: DISE  Has patient been spoken to by Provider and given informed consent? Y Anesthesia: propofol  Do you need Fluro? NA Duration of procedure: 52m  Date: 8/19 scheduled  Alternate Date:   Time: AM7-30 [facility]: [facility] Does patient have OSA? Y DM? NA Or Latex allergy? NA Medication Restriction/ Anticoagulate/Antiplatelet: STOP xarelto  day prior STOP wegovy 1 week prior Pre-op Labs Ordered:determined by Anesthesia Imaging request: NA

## 2023-11-09 ENCOUNTER — Ambulatory Visit (HOSPITAL_BASED_OUTPATIENT_CLINIC_OR_DEPARTMENT_OTHER): Admitting: Pulmonary Disease

## 2023-11-09 NOTE — Telephone Encounter (Signed)
 Called pt and left her a vm about the appt

## 2023-12-03 ENCOUNTER — Telehealth: Payer: Self-pay | Admitting: Pulmonary Disease

## 2023-12-03 NOTE — Telephone Encounter (Signed)
-----   Message from [place] B sent at 12/01/2023 10:15 AM EDT ----- Regarding: Reschedule DISE appointment Good Morning, This patient DISE procedure needs to be rescheduled as it requires a PA and Medicaid has 15 days from today to make a determination.

## 2023-12-03 NOTE — Telephone Encounter (Signed)
 if not approved by medicaid, Please reschedule to sep 2 @ 9-30 am

## 2023-12-04 ENCOUNTER — Telehealth: Payer: Self-pay | Admitting: Pulmonary Disease

## 2023-12-04 NOTE — Telephone Encounter (Addendum)
 Called Patient. She does not want to be scheduled on September 2nd due to her already calling off work for the procedure tomorrow.She requests to have it scheduled back to tomorrow. She's aware that we are waiting for an Authorization from insurance but she still wants it back on 12/05/23 and it will take 15 days from 12/01/23. Can you please give her a call on this before I reschedule back to tomorrow.

## 2023-12-04 NOTE — Telephone Encounter (Signed)
 Patient has been canceled on 12/19/23.

## 2023-12-04 NOTE — Telephone Encounter (Signed)
 FYI please advise I do not know where to send to cancel scheduling

## 2023-12-04 NOTE — Telephone Encounter (Signed)
 Patient has been rescheduled to 9/2 at 9:30am. I will update the letter that's been sent and let the patient know.

## 2023-12-04 NOTE — Telephone Encounter (Signed)
 Copied from CRM #[id number]. Topic: Appointments - Scheduling Inquiry for Clinic >> Dec 04, 2023  2:09 PM Joesph PARAS wrote: Reason for CRM: Patient is calling to request that upcoming admission for 09/02 be cancelled. She states somebody dropped the ball and is very upset. Patient said it was supposed to be tomorrow but patient is very expressly angry that she was messed up like this. She has no interest in rescheduling at this time.

## 2023-12-04 NOTE — Telephone Encounter (Signed)
 PT calling to cancel admission. She spoke to [facility] and stated someone dropped the ball. She is very upset and did not want to resched.

## 2023-12-05 DIAGNOSIS — G4733 Obstructive sleep apnea (adult) (pediatric): Secondary | ICD-10-CM | POA: Insufficient documentation

## 2023-12-05 NOTE — Telephone Encounter (Signed)
 Left pt a message to notify

## 2023-12-19 ENCOUNTER — Encounter (HOSPITAL_COMMUNITY): Admission: RE | Payer: Self-pay | Source: Home / Self Care

## 2023-12-19 ENCOUNTER — Ambulatory Visit (HOSPITAL_COMMUNITY): Admission: RE | Admit: 2023-12-19 | Source: Home / Self Care | Admitting: Pulmonary Disease

## 2023-12-19 SURGERY — DRUG INDUCED SLEEP ENDOSCOPY
Anesthesia: Monitor Anesthesia Care

## 2023-12-20 ENCOUNTER — Encounter: Admitting: Gastroenterology

## 2023-12-22 ENCOUNTER — Ambulatory Visit: Payer: Medicaid Other | Admitting: Cardiovascular Disease

## 2023-12-26 ENCOUNTER — Other Ambulatory Visit: Payer: Self-pay | Admitting: Cardiovascular Disease

## 2023-12-26 DIAGNOSIS — I4819 Other persistent atrial fibrillation: Secondary | ICD-10-CM

## 2023-12-26 NOTE — Telephone Encounter (Signed)
 Prescription refill request for Xarelto  received.  Indication: AF Last office visit: 08/16/23  A Mealor MD Weight: 88.9kg Age: 53 Scr: 0.84 on 07/04/23  Epic CrCl: 107.45  Based on above findings Xarelto  20mg  daily is the appropriate dose.  Refill approved.

## 2024-02-14 ENCOUNTER — Ambulatory Visit (HOSPITAL_COMMUNITY): Admitting: Physician Assistant

## 2024-02-14 ENCOUNTER — Encounter: Payer: Self-pay | Admitting: Cardiovascular Disease

## 2024-02-14 ENCOUNTER — Encounter (HOSPITAL_COMMUNITY): Payer: Self-pay

## 2024-02-14 ENCOUNTER — Other Ambulatory Visit: Payer: Self-pay | Admitting: Cardiology

## 2024-02-14 MED ORDER — BISOPROLOL FUMARATE 5 MG PO TABS: 2.5000 mg | ORAL_TABLET | Freq: Every day | ORAL | 1 refills | Status: AC

## 2024-02-29 ENCOUNTER — Other Ambulatory Visit (HOSPITAL_COMMUNITY): Payer: Self-pay | Admitting: Physician Assistant
